# Patient Record
Sex: Female | Born: 1955 | Race: White | Hispanic: Yes | Marital: Single | Smoking: Never smoker
Health system: Southern US, Community
[De-identification: ages and names within clinical notes are randomized; demographics above are authoritative.]

## PROBLEM LIST (undated history)

## (undated) DIAGNOSIS — J309 Allergic rhinitis, unspecified: Secondary | ICD-10-CM

## (undated) DIAGNOSIS — T7840XA Allergy, unspecified, initial encounter: Secondary | ICD-10-CM

## (undated) DIAGNOSIS — M545 Low back pain: Secondary | ICD-10-CM

## (undated) DIAGNOSIS — H04123 Dry eye syndrome of bilateral lacrimal glands: Secondary | ICD-10-CM

## (undated) DIAGNOSIS — H11001 Unspecified pterygium of right eye: Secondary | ICD-10-CM

## (undated) DIAGNOSIS — E785 Hyperlipidemia, unspecified: Secondary | ICD-10-CM

## (undated) DIAGNOSIS — B351 Tinea unguium: Secondary | ICD-10-CM

## (undated) DIAGNOSIS — H11153 Pinguecula, bilateral: Secondary | ICD-10-CM

## (undated) DIAGNOSIS — E119 Type 2 diabetes mellitus without complications: Secondary | ICD-10-CM

## (undated) DIAGNOSIS — K5732 Diverticulitis of large intestine without perforation or abscess without bleeding: Secondary | ICD-10-CM

## (undated) DIAGNOSIS — I10 Essential (primary) hypertension: Secondary | ICD-10-CM

## (undated) HISTORY — DX: Allergic rhinitis, unspecified: J30.9

## (undated) HISTORY — DX: Tinea unguium: B35.1

## (undated) HISTORY — DX: Type 2 diabetes mellitus without complications: E11.9

## (undated) HISTORY — DX: Pinguecula, bilateral: H11.153

## (undated) HISTORY — DX: Low back pain: M54.5

## (undated) HISTORY — DX: Dry eye syndrome of bilateral lacrimal glands: H04.123

## (undated) HISTORY — DX: Essential (primary) hypertension: I10

## (undated) HISTORY — DX: Diverticulitis of large intestine without perforation or abscess without bleeding: K57.32

## (undated) HISTORY — DX: Unspecified pterygium of right eye: H11.001

## (undated) HISTORY — DX: Allergy, unspecified, initial encounter: T78.40XA

## (undated) HISTORY — DX: Hyperlipidemia, unspecified: E78.5

---

## 2003-10-11 ENCOUNTER — Ambulatory Visit: Payer: Self-pay | Admitting: Family Medicine

## 2003-10-25 ENCOUNTER — Ambulatory Visit: Payer: Self-pay | Admitting: Family Medicine

## 2003-11-15 ENCOUNTER — Ambulatory Visit: Payer: Self-pay | Admitting: Family Medicine

## 2003-12-27 ENCOUNTER — Ambulatory Visit: Payer: Self-pay | Admitting: Family Medicine

## 2004-01-24 ENCOUNTER — Ambulatory Visit: Payer: Self-pay | Admitting: Family Medicine

## 2004-04-06 ENCOUNTER — Ambulatory Visit: Payer: Self-pay | Admitting: Family Medicine

## 2004-05-05 ENCOUNTER — Ambulatory Visit: Payer: Self-pay | Admitting: Family Medicine

## 2004-05-11 ENCOUNTER — Ambulatory Visit (HOSPITAL_COMMUNITY): Admission: RE | Admit: 2004-05-11 | Discharge: 2004-05-11 | Payer: Self-pay | Admitting: Family Medicine

## 2004-06-06 ENCOUNTER — Ambulatory Visit: Payer: Self-pay | Admitting: Family Medicine

## 2004-06-15 ENCOUNTER — Ambulatory Visit: Payer: Self-pay | Admitting: Sports Medicine

## 2004-07-24 ENCOUNTER — Ambulatory Visit: Payer: Self-pay | Admitting: Family Medicine

## 2004-08-07 ENCOUNTER — Ambulatory Visit: Payer: Self-pay | Admitting: Family Medicine

## 2004-10-08 ENCOUNTER — Encounter (INDEPENDENT_AMBULATORY_CARE_PROVIDER_SITE_OTHER): Payer: Self-pay | Admitting: *Deleted

## 2004-10-16 ENCOUNTER — Ambulatory Visit: Payer: Self-pay | Admitting: Family Medicine

## 2004-12-27 ENCOUNTER — Ambulatory Visit: Payer: Self-pay | Admitting: Family Medicine

## 2005-01-15 ENCOUNTER — Ambulatory Visit: Payer: Self-pay | Admitting: Family Medicine

## 2005-06-18 ENCOUNTER — Ambulatory Visit: Payer: Self-pay | Admitting: Family Medicine

## 2005-06-21 ENCOUNTER — Ambulatory Visit (HOSPITAL_COMMUNITY): Admission: RE | Admit: 2005-06-21 | Discharge: 2005-06-21 | Payer: Self-pay | Admitting: Family Medicine

## 2005-07-16 ENCOUNTER — Ambulatory Visit: Payer: Self-pay | Admitting: Family Medicine

## 2005-10-29 ENCOUNTER — Ambulatory Visit (HOSPITAL_COMMUNITY): Admission: RE | Admit: 2005-10-29 | Discharge: 2005-10-29 | Payer: Self-pay | Admitting: Family Medicine

## 2005-10-29 ENCOUNTER — Ambulatory Visit: Payer: Self-pay | Admitting: Family Medicine

## 2005-12-24 ENCOUNTER — Ambulatory Visit: Payer: Self-pay | Admitting: Family Medicine

## 2006-03-07 DIAGNOSIS — E785 Hyperlipidemia, unspecified: Secondary | ICD-10-CM

## 2006-03-07 DIAGNOSIS — I10 Essential (primary) hypertension: Secondary | ICD-10-CM

## 2006-03-08 ENCOUNTER — Encounter (INDEPENDENT_AMBULATORY_CARE_PROVIDER_SITE_OTHER): Payer: Self-pay | Admitting: *Deleted

## 2006-04-15 ENCOUNTER — Ambulatory Visit: Payer: Self-pay | Admitting: Family Medicine

## 2006-04-15 LAB — CONVERTED CEMR LAB: Hgb A1c MFr Bld: 6.8 %

## 2006-05-07 ENCOUNTER — Ambulatory Visit: Payer: Self-pay | Admitting: Family Medicine

## 2006-05-07 ENCOUNTER — Encounter: Payer: Self-pay | Admitting: Family Medicine

## 2006-05-07 LAB — CONVERTED CEMR LAB
CO2: 23 meq/L (ref 19–32)
Calcium: 9.5 mg/dL (ref 8.4–10.5)
Cholesterol: 143 mg/dL (ref 0–200)
Creatinine, Ser: 0.79 mg/dL (ref 0.40–1.20)
HDL: 47 mg/dL (ref >39)
Sodium: 136 meq/L (ref 135–145)
Total CHOL/HDL Ratio: 3

## 2006-05-08 ENCOUNTER — Encounter: Payer: Self-pay | Admitting: Family Medicine

## 2006-05-13 ENCOUNTER — Ambulatory Visit: Payer: Self-pay | Admitting: Family Medicine

## 2006-05-24 ENCOUNTER — Encounter: Payer: Self-pay | Admitting: Family Medicine

## 2006-08-12 ENCOUNTER — Encounter: Payer: Self-pay | Admitting: Family Medicine

## 2006-08-12 ENCOUNTER — Ambulatory Visit: Payer: Self-pay | Admitting: Sports Medicine

## 2006-08-12 LAB — CONVERTED CEMR LAB: Hgb A1c MFr Bld: 7.2 %

## 2006-08-14 ENCOUNTER — Encounter: Payer: Self-pay | Admitting: Family Medicine

## 2006-09-03 ENCOUNTER — Ambulatory Visit (HOSPITAL_COMMUNITY): Admission: RE | Admit: 2006-09-03 | Discharge: 2006-09-03 | Payer: Self-pay | Admitting: Family Medicine

## 2006-09-03 ENCOUNTER — Encounter: Payer: Self-pay | Admitting: Family Medicine

## 2006-11-18 ENCOUNTER — Ambulatory Visit: Payer: Self-pay | Admitting: Family Medicine

## 2006-11-18 DIAGNOSIS — B351 Tinea unguium: Secondary | ICD-10-CM | POA: Insufficient documentation

## 2006-11-18 DIAGNOSIS — K5900 Constipation, unspecified: Secondary | ICD-10-CM | POA: Insufficient documentation

## 2006-11-18 HISTORY — DX: Tinea unguium: B35.1

## 2006-11-18 LAB — CONVERTED CEMR LAB: Hgb A1c MFr Bld: 7.5 %

## 2007-02-24 ENCOUNTER — Ambulatory Visit: Payer: Self-pay | Admitting: Family Medicine

## 2007-04-14 ENCOUNTER — Encounter: Payer: Self-pay | Admitting: Family Medicine

## 2007-04-14 ENCOUNTER — Ambulatory Visit: Payer: Self-pay | Admitting: Family Medicine

## 2007-04-14 LAB — CONVERTED CEMR LAB
Cholesterol: 144 mg/dL (ref 0–200)
HDL: 39 mg/dL — ABNORMAL LOW (ref >39)
LDL Cholesterol: 72 mg/dL (ref 0–99)
Triglycerides: 164 mg/dL — ABNORMAL HIGH (ref <150)
VLDL: 33 mg/dL (ref 0–40)

## 2007-04-15 ENCOUNTER — Encounter: Payer: Self-pay | Admitting: Family Medicine

## 2007-04-21 ENCOUNTER — Ambulatory Visit: Payer: Self-pay | Admitting: Family Medicine

## 2007-06-30 ENCOUNTER — Ambulatory Visit: Payer: Self-pay | Admitting: Family Medicine

## 2007-06-30 ENCOUNTER — Encounter: Payer: Self-pay | Admitting: Family Medicine

## 2007-06-30 LAB — CONVERTED CEMR LAB
Albumin: 4.6 g/dL (ref 3.5–5.2)
BUN: 20 mg/dL (ref 6–23)
CO2: 24 meq/L (ref 19–32)
Glucose, Bld: 96 mg/dL (ref 70–99)
Potassium: 4.4 meq/L (ref 3.5–5.3)
Sodium: 139 meq/L (ref 135–145)
Total Protein: 7 g/dL (ref 6.0–8.3)

## 2007-09-04 ENCOUNTER — Ambulatory Visit (HOSPITAL_COMMUNITY): Admission: RE | Admit: 2007-09-04 | Discharge: 2007-09-04 | Payer: Self-pay | Admitting: Family Medicine

## 2007-10-09 HISTORY — PX: BIOPSY ENDOMETRIAL: PRO11

## 2007-10-13 ENCOUNTER — Ambulatory Visit: Payer: Self-pay | Admitting: Family Medicine

## 2007-10-13 DIAGNOSIS — N949 Unspecified condition associated with female genital organs and menstrual cycle: Secondary | ICD-10-CM

## 2007-10-13 LAB — CONVERTED CEMR LAB: Hgb A1c MFr Bld: 8 %

## 2007-10-20 ENCOUNTER — Ambulatory Visit: Payer: Self-pay | Admitting: Family Medicine

## 2007-10-20 ENCOUNTER — Encounter: Payer: Self-pay | Admitting: Family Medicine

## 2007-10-22 ENCOUNTER — Encounter: Payer: Self-pay | Admitting: Family Medicine

## 2007-11-06 ENCOUNTER — Encounter: Payer: Self-pay | Admitting: Family Medicine

## 2007-11-28 ENCOUNTER — Encounter: Payer: Self-pay | Admitting: Family Medicine

## 2008-01-12 ENCOUNTER — Ambulatory Visit: Payer: Self-pay | Admitting: Family Medicine

## 2008-01-12 LAB — CONVERTED CEMR LAB: Hgb A1c MFr Bld: 8.6 %

## 2008-02-16 ENCOUNTER — Ambulatory Visit: Payer: Self-pay | Admitting: Family Medicine

## 2008-04-26 ENCOUNTER — Ambulatory Visit: Payer: Self-pay | Admitting: Family Medicine

## 2008-04-26 LAB — CONVERTED CEMR LAB
AST: 30 units/L (ref 0–37)
Alkaline Phosphatase: 66 units/L (ref 39–117)
BUN: 15 mg/dL (ref 6–23)
Calcium: 9.7 mg/dL (ref 8.4–10.5)
Creatinine, Ser: 0.71 mg/dL (ref 0.40–1.20)
HDL: 41 mg/dL (ref >39)
Total Bilirubin: 0.4 mg/dL (ref 0.3–1.2)
Total CHOL/HDL Ratio: 3.7
VLDL: 27 mg/dL (ref 0–40)

## 2008-05-03 ENCOUNTER — Ambulatory Visit: Payer: Self-pay | Admitting: Family Medicine

## 2008-05-03 LAB — CONVERTED CEMR LAB: Hgb A1c MFr Bld: 9 %

## 2008-05-31 ENCOUNTER — Ambulatory Visit: Payer: Self-pay | Admitting: Family Medicine

## 2008-07-05 ENCOUNTER — Ambulatory Visit: Payer: Self-pay | Admitting: Family Medicine

## 2008-08-02 ENCOUNTER — Ambulatory Visit: Payer: Self-pay | Admitting: Family Medicine

## 2008-08-02 LAB — CONVERTED CEMR LAB: Hgb A1c MFr Bld: 6.6 %

## 2008-11-01 ENCOUNTER — Ambulatory Visit: Payer: Self-pay | Admitting: Family Medicine

## 2008-11-01 ENCOUNTER — Encounter (INDEPENDENT_AMBULATORY_CARE_PROVIDER_SITE_OTHER): Payer: Self-pay | Admitting: *Deleted

## 2008-11-29 ENCOUNTER — Encounter: Payer: Self-pay | Admitting: Family Medicine

## 2009-02-14 ENCOUNTER — Ambulatory Visit: Payer: Self-pay | Admitting: Family Medicine

## 2009-05-16 ENCOUNTER — Ambulatory Visit: Payer: Self-pay | Admitting: Family Medicine

## 2009-05-16 ENCOUNTER — Encounter: Payer: Self-pay | Admitting: Family Medicine

## 2009-05-16 LAB — CONVERTED CEMR LAB
AST: 15 units/L (ref 0–37)
Albumin: 4.8 g/dL (ref 3.5–5.2)
Alkaline Phosphatase: 57 units/L (ref 39–117)
Chloride: 103 meq/L (ref 96–112)
Direct LDL: 134 mg/dL — ABNORMAL HIGH
Glucose, Bld: 66 mg/dL — ABNORMAL LOW (ref 70–99)
Potassium: 3.9 meq/L (ref 3.5–5.3)
Sodium: 140 meq/L (ref 135–145)
Total Protein: 7.6 g/dL (ref 6.0–8.3)

## 2009-05-19 ENCOUNTER — Telehealth: Payer: Self-pay | Admitting: Family Medicine

## 2009-05-19 ENCOUNTER — Encounter: Payer: Self-pay | Admitting: Family Medicine

## 2009-07-25 ENCOUNTER — Encounter: Payer: Self-pay | Admitting: Family Medicine

## 2009-07-25 ENCOUNTER — Ambulatory Visit: Payer: Self-pay | Admitting: Family Medicine

## 2009-07-26 ENCOUNTER — Telehealth: Payer: Self-pay | Admitting: Family Medicine

## 2009-09-19 ENCOUNTER — Ambulatory Visit: Payer: Self-pay | Admitting: Family Medicine

## 2009-09-29 ENCOUNTER — Ambulatory Visit (HOSPITAL_COMMUNITY): Admission: RE | Admit: 2009-09-29 | Discharge: 2009-09-29 | Payer: Self-pay | Admitting: Family Medicine

## 2009-10-17 ENCOUNTER — Ambulatory Visit: Payer: Self-pay | Admitting: Family Medicine

## 2009-11-08 ENCOUNTER — Encounter: Payer: Self-pay | Admitting: Pharmacist

## 2009-11-14 ENCOUNTER — Ambulatory Visit: Payer: Self-pay | Admitting: Family Medicine

## 2009-11-14 LAB — CONVERTED CEMR LAB
Eosinophils Relative: 3 % (ref 0–5)
GC Probe Amp, Genital: NEGATIVE
HCT: 38.5 % (ref 36.0–46.0)
Hemoglobin: 12.2 g/dL (ref 12.0–15.0)
Lymphocytes Relative: 17 % (ref 12–46)
Lymphs Abs: 1.5 10*3/uL (ref 0.7–4.0)
Monocytes Absolute: 0.8 10*3/uL (ref 0.1–1.0)
WBC: 9 10*3/uL (ref 4.0–10.5)

## 2009-11-16 ENCOUNTER — Encounter: Payer: Self-pay | Admitting: Family Medicine

## 2009-11-16 ENCOUNTER — Telehealth: Payer: Self-pay | Admitting: Family Medicine

## 2009-11-28 ENCOUNTER — Ambulatory Visit: Payer: Self-pay | Admitting: Family Medicine

## 2009-11-28 DIAGNOSIS — E1149 Type 2 diabetes mellitus with other diabetic neurological complication: Secondary | ICD-10-CM

## 2009-11-28 DIAGNOSIS — K5732 Diverticulitis of large intestine without perforation or abscess without bleeding: Secondary | ICD-10-CM | POA: Insufficient documentation

## 2009-11-28 HISTORY — DX: Diverticulitis of large intestine without perforation or abscess without bleeding: K57.32

## 2009-12-26 ENCOUNTER — Ambulatory Visit: Payer: Self-pay

## 2010-02-05 LAB — CONVERTED CEMR LAB
Beta hcg, urine, semiquantitative: NEGATIVE
MCHC: 32.9 g/dL (ref 30.0–36.0)
MCV: 79.8 fL (ref 78.0–100.0)
Platelets: 475 10*3/uL — ABNORMAL HIGH (ref 150–400)
RDW: 15 % (ref 11.5–15.5)
TSH: 2.497 microintl units/mL (ref 0.350–4.50)

## 2010-02-08 NOTE — Progress Notes (Signed)
Summary: Allegic reaction   Phone Note Call from Patient   Caller: Patient Summary of Call: Pt stated after she started FLAGIL 500mg  started  with allergic, she stated to  has a rash on her hands and arms. She stop to take the medicine. Initial call taken by: Marines Jean Rosenthal,  November 16, 2009 10:29 AM  Follow-up for Phone Call        Tell her to stop both the flagyl and the Cipo - don't know which one she's having the allergic reaction to.  I will need to ask Dr. Sheffield Slider for a substitute. We will call her back. Follow-up by: Dennison Nancy RN,  November 16, 2009 10:31 AM  Additional Follow-up for Phone Call Additional follow up Details #1::        I called pt and tell her stop take flagil and cipo. She will wait for [facility] call her. Additional Follow-up by: Marines Jean Rosenthal,  November 16, 2009 11:24 AM   New Allergies: FLAGYL (METRONIDAZOLE) CIPRO (CIPROFLOXACIN HCL) New/Updated Medications: AMOXICILLIN-POT CLAVULANATE 1000-62.5 MG XR12H-TAB (AMOXICILLIN-POT CLAVULANATE) Take one tab two times a day New Allergies: FLAGYL (METRONIDAZOLE) CIPRO (CIPROFLOXACIN HCL)Prescriptions: AMOXICILLIN-POT CLAVULANATE 1000-62.5 MG XR12H-TAB (AMOXICILLIN-POT CLAVULANATE) Take one tab two times a day  #14 x 0   Entered and Authorized by:   Zachery Dauer MD   Signed by:   Zachery Dauer MD on 11/16/2009   Method used:   Electronically to        [id number]* (retail)       [address] [place], [place]  [address]       Ph: [phone]       Fax: [phone]   RxID:   [id number]

## 2010-02-08 NOTE — Assessment & Plan Note (Signed)
Summary: Abdominal pain/ eo   Vital Signs:  Patient profile:   55 year old female Menstrual status:  regular Height:      57.5 inches Weight:      114 pounds BMI:     24.33 Temp:     98.4 degrees F oral Pulse rate:   92 / minute BP sitting:   147 / 74  (right arm) Cuff size:   regular  Vitals Entered By: Tessie Fass CMA (November 14, 2009 1:50 PM) CC: F/U, Hypertension Management, Lipid Management Is Patient Diabetic? Yes Pain Assessment Patient in pain? yes     Location: abdomen Intensity: 5   Primary Care Erin Small:  Zachery Dauer  CC:  F/U, Hypertension Management, and Lipid Management.  History of Present Illness: Erin Small is a pleasant 55 year old Hispanic female who [facility] for f/u of cough.  Patient reports that her cough has resolved with completion of her antibx therapy.    Today she has c/o "ovary pain" which began 2 days ago and feels similar to "ovary pain" that she had in Jan/Feb per patient report.  She describes this left lower abdomen pain as crampy and persistent especially when sitting upright.  She denies nausea, vomiting, diarrhea or constipation but reports increased abdominal bloating and gas sounds but no increased flatulence.    She is married and has had recent sexual intercourse.  Her LMP was Oct 2011 but spotty.  She reports occasional hot flashes but denies vaginal itchiness or dryness.  Recent capillary blood glucose have not been elevated.   Hypertension History:      She denies chest pain and palpitations.        Positive major cardiovascular risk factors include diabetes, hyperlipidemia, and hypertension.  Negative major cardiovascular risk factors include female age less than 43 years old, negative family history for ischemic heart disease, and non-tobacco-user status.        Further assessment for target organ damage reveals no history of ASHD, stroke/TIA, or peripheral vascular disease.    Lipid Management History:    Positive NCEP/ATP III risk factors include diabetes and hypertension.  Negative NCEP/ATP III risk factors include female age less than 64 years old, no family history for ischemic heart disease, non-tobacco-user status, no ASHD (atherosclerotic heart disease), no prior stroke/TIA, no peripheral vascular disease, and no history of aortic aneurysm.      Allergies: No Known Drug Allergies  Physical Exam  General:  Mildly ill appearing  Lungs:  Normal respiratory effort, chest expands symmetrically. Lungs are clear to auscultation, no crackles or wheezes. Abdomen:  Bowel sounds positive,abdomen soft and non-tender without masses, organomegaly or hernias noted. No guarding but complains of discomfort in the LLQ when pressed anywhere in the abdomen. No pain with cough or percussion.  Rectal:  No external abnormalities noted. Normal sphincter tone. No rectal masses or tenderness. Small amount of soft stool, heme neg Genitalia:  normal introitus, no external lesions, no vaginal discharge, mucosa pink and moist, no vaginal or cervical lesions, no friaility or hemorrhage, normal uterus size and position, and no adnexal masses. Slightly tender left adnexae or above. Psych:  normally interactive.     Impression & Recommendations:  Problem # 1:  diverticulitis 1. pelvic exam performed, no cervical motion or significant adnexal tenderness 2. will prescribe Ciprofloxacin 500mg  bid and Metronidazole 500mg  three times a day 3. will check CBC with diff to look for WBC shift 4. Urinalysis today r/o UTI 5. flu  shot today for preventative care 6. Pap smear today; GC/Chlamydia probe; hemoccult negative today  Problem # 2:  DIABETES MELLITUS II, UNCOMPLICATED (ICD-250.00)  Her updated medication list for this problem includes:    Bayer Childrens Aspirin 81 Mg Chew (Aspirin) .Marland Kitchen... Take 1 tablet by mouth once a day    Metformin Hcl 1000 Mg Tabs (Metformin hcl) .Marland Kitchen... Take one by mouth two times a day     Lisinopril-hydrochlorothiazide 10-12.5 Mg Tabs (Lisinopril-hydrochlorothiazide) .Marland Kitchen... 2 tablet by mouth daily    Diabeta 5 Mg Tabs (Glyburide) .Marland Kitchen... Take two in the morning and two in the p.m.  Orders: [facility]- Est Level  3 (29562)  Complete Medication List: 1)  Bayer Childrens Aspirin 81 Mg Chew (Aspirin) .... Take 1 tablet by mouth once a day 2)  Metformin Hcl 1000 Mg Tabs (Metformin hcl) .... Take one by mouth two times a day 3)  Simvastatin 40 Mg Tabs (Simvastatin) .... Take 2 tablets by mouth at bedtime 4)  Lisinopril-hydrochlorothiazide 10-12.5 Mg Tabs (Lisinopril-hydrochlorothiazide) .... 2 tablet by mouth daily 5)  Therapeutic Multivitamin Tabs (Multiple vitamin) .... Take one tab daily 6)  Caltrate 600+d 600-400 Mg-unit Tabs (Calcium carbonate-vitamin d) .... Take one tablet twice daily 7)  Diabeta 5 Mg Tabs (Glyburide) .... Take two in the morning and two in the p.m. 8)  Cipro 500 Mg Tabs (Ciprofloxacin hcl) .... Take one tab two times a day for 10 days 9)  Flagyl 500 Mg Tabs (Metronidazole) .... Take one tab three times a day  Other Orders: CBC w/Diff-[facility] (13086) GC/Chlamydia-[facility] (87591/87491) Pap Smear-[facility] (57846-96295) Urinalysis-[facility] (00000) U Preg-[facility] (28413)  Hypertension Assessment/Plan:      The patient's hypertensive risk group is category C: Target organ damage and/or diabetes.  Her calculated 10 year risk of coronary heart disease is 17 %.  Today's blood pressure is 147/74.  Her blood pressure goal is < 130/80.  Lipid Assessment/Plan:      Based on NCEP/ATP III, the patient's risk factor category is "history of diabetes".  The patient's lipid goals are as follows: Total cholesterol goal is 200; LDL cholesterol goal is 100; HDL cholesterol goal is 40; Triglyceride goal is 150.     Patient Instructions: 1)  Please schedule a follow-up appointment in 1 week.  2)  Regrese en una semana.  3)  Voy a llamarle si hay problema con las pruebas de [place].   Prescriptions: FLAGYL 500 MG TABS (METRONIDAZOLE) Take one tab three times a day  #30 x 0   Entered by:   Milinda Antis MD   Authorized by:   Zachery Dauer MD   Signed by:   Milinda Antis MD on 11/14/2009   Method used:   Electronically to        [facility] #[id number]* (retail)       [address] [place], [place]  [address]       Ph: [phone]       Fax: [phone]   RxID:   [id number] CIPRO 500 MG TABS (CIPROFLOXACIN HCL) Take one tab two times a day for 10 days  #20 x 0   Entered by:   Milinda Antis MD   Authorized by:   Zachery Dauer MD   Signed by:   Milinda Antis MD on 11/14/2009   Method used:   Electronically to        [facility] #[id number]* (retail)       [address]       [place], [place]  [address]       Ph: [phone]       Fax: [phone]   RxID:   [id number]    Orders Added: 1)  CBC w/Diff-[facility] [85025] 2)  GC/Chlamydia-[facility] [87591/87491] 3)  Pap Smear-[facility] [74259-56387] 4)  Urinalysis-[facility] [00000] 5)  [facility]- Est Level  3 [99213] 6)  U Preg-[facility] [81025]     Prevention & Chronic Care Immunizations   Influenza vaccine: Fluvax Non-MCR  (11/01/2008)   Influenza vaccine due: 10/12/2008    Tetanus booster: 05/08/2004: Done.   Tetanus booster due: 05/09/2014    Pneumococcal vaccine: Done.  (06/08/2005)   Pneumococcal vaccine due: None  Colorectal Screening   Hemoccult: Not documented    Colonoscopy: Not documented  Other Screening   Pap smear: NEGATIVE FOR INTRAEPITHELIAL LESIONS OR MALIGNANCY.  (10/20/2007)   Pap smear due: 10/19/2008    Mammogram: Assessment: BIRADS 1. Location: [facility].     (09/29/2009)   Mammogram action/deferral: Screening mammogram in 1 year.     (09/29/2009)   Mammogram due: 09/04/2008   Smoking status: never  (10/17/2009)  Diabetes Mellitus   HgbA1C: 7.9  (09/19/2009)   Hemoglobin A1C due: 05/14/2009    Eye exam: No diabetic retinopathy.   [facility] eye   (11/29/2008)   Eye exam due: 11/29/2009    Foot exam: yes  (05/16/2009)   Foot exam action/deferral: Do today   High risk foot: Not documented   Foot care education: completed  (07/05/2008)   Foot exam due: 07/05/2009    Urine microalbumin/creatinine ratio: Not documented   Urine microalbumin/cr due: Not Indicated  Lipids   Total Cholesterol: 151  (04/26/2008)   LDL: 83  (04/26/2008)   LDL Direct: 73  (07/25/2009)   HDL: 41  (04/26/2008)   Triglycerides: 133  (04/26/2008)    SGOT (AST): 15  (05/16/2009)   SGPT (ALT): 22  (05/16/2009)   Alkaline phosphatase: 57  (05/16/2009)   Total bilirubin: 0.3  (05/16/2009)  Hypertension   Last Blood Pressure: 147 / 74  (11/14/2009)   Serum creatinine: 0.81  (05/16/2009)   Serum potassium 3.9  (05/16/2009)  Self-Management Support :   Personal Goals (by the next clinic visit) :     Personal A1C goal: 7  (11/01/2008)     Personal blood pressure goal: 130/80  (11/01/2008)     Personal LDL goal: 100  (11/01/2008)    Diabetes self-management support: CBG self-monitoring log, Education handout  (09/19/2009)   Last diabetes self-management training by diabetes educator: completed    Hypertension self-management support: Not documented    Lipid self-management support: Not documented    Appended Document: urine reports    Lab Visit  Laboratory Results   Urine Tests  Date/Time Received: November 14, 2009 3:33 PM  Date/Time Reported: November 14, 2009 3:38 PM   Routine Urinalysis   Color: yellow Appearance: Clear Glucose: 100   (Normal Range: Negative) Bilirubin: negative   (Normal Range: Negative) Ketone: trace (5)   (Normal Range: Negative) Spec. Gravity: 1.025   (Normal Range: 1.003-1.035) Blood: negative   (Normal Range: Negative) pH: 5.5   (Normal Range: 5.0-8.0) Protein: negative   (Normal Range: Negative) Urobilinogen: 0.2   (Normal Range: 0-1) Nitrite: negative   (Normal Range: Negative) Leukocyte Esterace:  negative   (Normal Range: Negative)    Urine HCG: negative Comments: ...............test performed by......Marland KitchenBonnie A. [place], MLS (ASCP)cm    Orders Today:

## 2010-02-08 NOTE — Progress Notes (Signed)
  Phone Note Outgoing Call   Call placed by: Paula Compton MD,  July 26, 2009 7:59 AM Call placed to: Patient    Called patient at home, conversation in [place].  Relayed excellent LDL value.  She is pleased.  Changing diet to reduce cholesterol intake.  Plan to follow up on DM in 2 months.  She agrees, will call for appt.  Paula Compton MD  July 26, 2009 8:01 AM

## 2010-02-08 NOTE — Assessment & Plan Note (Signed)
Summary: f/u,df   Vital Signs:  Patient profile:   55 year old female Menstrual status:  regular Height:      57.5 inches Weight:      112 pounds BMI:     23.90 Temp:     100 degrees F oral Pulse rate:   88 / minute BP sitting:   130 / 80  Vitals Entered By: Arlyss Repress CMA, (October 17, 2009 1:38 PM) CC: body aches and cough. feels like the flu. Is Patient Diabetic? Yes Pain Assessment Patient in pain? yes     Location: body Intensity: 5 Onset of pain  x2 days.   Primary Care Provider:  Zachery Dauer  CC:  body aches and cough. feels like the flu.Marland Kitchen  History of Present Illness: Four days of "grippe" with myalgias,fever, chills, and increasing cough. Chest hurts both sides with the cough which is dry.  No nausea or diarrhea.   fasting capillary blood glucoses all under 100, those at bedtime 157, 171.   Her metformin label says 1.5 in AM and 1 in PM of 100 mg, but she is only taking 1 tab two times a day.  Habits & Providers  Alcohol-Tobacco-Diet     Tobacco Status: never  Allergies: No Known Drug Allergies  Physical Exam  General:  Mildly ill appearing  Eyes:  No corneal or conjunctival inflammation noted. EOMI. Perrla. Funduscopic exam benign, without hemorrhages, exudates or papilledema. Vision grossly normal. Ears:  External ear exam shows no significant lesions or deformities.  Otoscopic examination reveals clear canals, tympanic membranes are intact bilaterally without bulging, retraction, inflammation or discharge. Hearing is grossly normal bilaterally. Nose:  clear nasal discharge Mouth:  Oral mucosa and oropharynx without lesions or exudates.  Teeth in good repair. Lungs:  crackles and rhonchi RLL posteriorly that did not clear with multiple coughs. Non-productive Heart:  Normal rate and regular rhythm. S1 and S2 normal without gallop, murmur, click, rub or other extra sounds. Abdomen:  Bowel sounds positive,abdomen soft and non-tender without masses,  organomegaly or hernias noted.   Impression & Recommendations:  Problem # 1:  BACTERIAL PNEUMONIA, RIGHT LOWER LOBE (ICD-482.9)  May be viral, but given her diabetes and lack of insurance coverage for a chest x-ray or labs will treat to cover pneumococcus and  Her updated medication list for this problem includes:    Avelox 400 Mg Tabs (Moxifloxacin hcl) .Marland Kitchen... Take one tablet daily for 7 days.  Orders: [facility]- Est Level  3 (03474)  Problem # 2:  DIABETES MELLITUS II, UNCOMPLICATED (ICD-250.00)  Improved  Her updated medication list for this problem includes:    Bayer Childrens Aspirin 81 Mg Chew (Aspirin) .Marland Kitchen... Take 1 tablet by mouth once a day    Metformin Hcl 1000 Mg Tabs (Metformin hcl) .Marland Kitchen... Take one by mouth two times a day    Lisinopril-hydrochlorothiazide 10-12.5 Mg Tabs (Lisinopril-hydrochlorothiazide) .Marland Kitchen... 2 tablet by mouth daily    Diabeta 5 Mg Tabs (Glyburide) .Marland Kitchen... Take two in the morning and two in the p.m.  Orders: [facility]- Est Level  3 (25956)  Complete Medication List: 1)  Bayer Childrens Aspirin 81 Mg Chew (Aspirin) .... Take 1 tablet by mouth once a day 2)  Metformin Hcl 1000 Mg Tabs (Metformin hcl) .... Take one by mouth two times a day 3)  Simvastatin 40 Mg Tabs (Simvastatin) .... Take 2 tablets by mouth at bedtime 4)  Lisinopril-hydrochlorothiazide 10-12.5 Mg Tabs (Lisinopril-hydrochlorothiazide) .... 2 tablet by mouth daily 5)  Therapeutic Multivitamin Tabs (  Multiple vitamin) .... Take one tab daily 6)  Caltrate 600+d 600-400 Mg-unit Tabs (Calcium carbonate-vitamin d) .... Take one tablet twice daily 7)  Diabeta 5 Mg Tabs (Glyburide) .... Take two in the morning and two in the p.m. 8)  Avelox 400 Mg Tabs (Moxifloxacin hcl) .... Take one tablet daily for 7 days.  Patient Instructions: 1)  Please schedule a follow-up appointment in 1 month.  2)  [name] cita en un mes para visita y vacuna para la gripe.  3)  Llame Dr Sheffield Slider en 3 dias, antes si no puede tomar la  medicina.  Prescriptions: AVELOX 400 MG TABS (MOXIFLOXACIN HCL) Take one tablet daily for 7 days.  #7 x 0   Entered and Authorized by:   Zachery Dauer MD   Signed by:   Zachery Dauer MD on 10/17/2009   Method used:   Print then Give to Patient   RxID:   [id number] AVELOX 400 MG TABS (MOXIFLOXACIN HCL) Take one tablet daily for 7 days.  #7 x 0   Entered and Authorized by:   Zachery Dauer MD   Signed by:   Zachery Dauer MD on 10/17/2009   Method used:   Electronically to        [id number]* (retail)       [address] [place], [place]  [address]       Ph: [phone]       Fax: [phone]   RxID:   [id number]    Prevention & Chronic Care Immunizations   Influenza vaccine: Fluvax Non-MCR  (11/01/2008)   Influenza vaccine due: 10/12/2008    Tetanus booster: 05/08/2004: Done.   Tetanus booster due: 05/09/2014    Pneumococcal vaccine: Done.  (06/08/2005)   Pneumococcal vaccine due: None  Colorectal Screening   Hemoccult: Not documented    Colonoscopy: Not documented  Other Screening   Pap smear: NEGATIVE FOR INTRAEPITHELIAL LESIONS OR MALIGNANCY.  (10/20/2007)   Pap smear due: 10/19/2008    Mammogram: Assessment: BIRADS 1. Location: [facility].     (09/29/2009)   Mammogram action/deferral: Screening mammogram in 1 year.     (09/29/2009)   Mammogram due: 09/04/2008   Smoking status: never  (10/17/2009)  Diabetes Mellitus   HgbA1C: 7.9  (09/19/2009)   Hemoglobin A1C due: 05/14/2009    Eye exam: No diabetic retinopathy.   [facility] eye  (11/29/2008)   Eye exam due: 11/29/2009    Foot exam: yes  (05/16/2009)   Foot exam action/deferral: Do today   High risk foot: Not documented   Foot care education: completed  (07/05/2008)   Foot exam due: 07/05/2009    Urine microalbumin/creatinine ratio: Not documented   Urine microalbumin/cr due: Not Indicated    Diabetes flowsheet reviewed?: Yes   Progress toward A1C goal:  Improved  Lipids   Total Cholesterol: 151  (04/26/2008)   LDL: 83  (04/26/2008)   LDL Direct: 73  (07/25/2009)   HDL: 41  (04/26/2008)   Triglycerides: 133  (04/26/2008)    SGOT (AST): 15  (05/16/2009)   SGPT (ALT): 22  (05/16/2009)   Alkaline phosphatase: 57  (05/16/2009)   Total bilirubin: 0.3  (05/16/2009)    Lipid flowsheet reviewed?: Yes   Progress toward LDL goal: At goal  Hypertension   Last Blood Pressure: 130 / 80  (10/17/2009)   Serum creatinine: 0.81  (05/16/2009)   Serum potassium 3.9  (05/16/2009)    Hypertension flowsheet  reviewed?: Yes   Progress toward BP goal: At goal  Self-Management Support :   Personal Goals (by the next clinic visit) :     Personal A1C goal: 7  (11/01/2008)     Personal blood pressure goal: 130/80  (11/01/2008)     Personal LDL goal: 100  (11/01/2008)    Diabetes self-management support: CBG self-monitoring log, Education handout  (09/19/2009)   Last diabetes self-management training by diabetes educator: completed    Hypertension self-management support: Not documented    Lipid self-management support: Not documented

## 2010-02-08 NOTE — Progress Notes (Signed)
  Phone Note Outgoing Call   Call placed by: Paula Compton MD,  May 19, 2009 4:56 PM Call placed to: Patient Summary of Call: Called to discuss labs.  A1C the same, elevated LDL above goal of 100.  To increase simva to 80/day, recheck in 2 months (after July 4th).  Will send new Rx for simva, future-order for direct LDL. Initial call taken by: Paula Compton MD,  May 19, 2009 4:57 PM    New/Updated Medications: SIMVASTATIN 40 MG TABS (SIMVASTATIN) Take 2 tablets by mouth at bedtime Prescriptions: SIMVASTATIN 40 MG TABS (SIMVASTATIN) Take 2 tablets by mouth at bedtime  #60 x 6   Entered and Authorized by:   Paula Compton MD   Signed by:   Paula Compton MD on 05/19/2009   Method used:   Electronically to        [facility] #[id number]* (retail)       [address] [place], [place]  [address]       Ph: [phone]       Fax: [phone]   RxID:   [id number]

## 2010-02-08 NOTE — Assessment & Plan Note (Signed)
Summary: problem list rev    Prevention & Chronic Care Immunizations   Influenza vaccine: Fluvax Non-MCR  (11/01/2008)   Influenza vaccine due: 10/12/2008    Tetanus booster: 05/08/2004: Done.   Tetanus booster due: 05/09/2014    Pneumococcal vaccine: Done.  (06/08/2005)   Pneumococcal vaccine due: None  Colorectal Screening   Hemoccult: Not documented    Colonoscopy: Not documented  Other Screening   Pap smear:  Specimen Adequacy: Satisfactory for evaluation.   Interpretation/Result:Negative for intraepithelial Lesion or Malignancy.     (11/14/2009)   Pap smear due: 10/19/2008    Mammogram: Assessment: BIRADS 1. Location: [facility].     (09/29/2009)   Mammogram action/deferral: Screening mammogram in 1 year.     (09/29/2009)   Mammogram due: 09/04/2008   Smoking status: never  (10/17/2009)  Diabetes Mellitus   HgbA1C: 7.9  (09/19/2009)   Hemoglobin A1C due: 05/14/2009    Eye exam: No diabetic retinopathy.   [facility] eye  (11/29/2008)   Eye exam due: 11/29/2009    Foot exam: yes  (05/16/2009)   Foot exam action/deferral: Do today   High risk foot: Not documented   Foot care education: completed  (07/05/2008)   Foot exam due: 07/05/2009    Urine microalbumin/creatinine ratio: Not documented   Urine microalbumin/cr due: Not Indicated  Lipids   Total Cholesterol: 151  (04/26/2008)   LDL: 83  (04/26/2008)   LDL Direct: 73  (07/25/2009)   HDL: 41  (04/26/2008)   Triglycerides: 133  (04/26/2008)    SGOT (AST): 15  (05/16/2009)   SGPT (ALT): 22  (05/16/2009)   Alkaline phosphatase: 57  (05/16/2009)   Total bilirubin: 0.3  (05/16/2009)  Hypertension   Last Blood Pressure: 147 / 74  (11/14/2009)   Serum creatinine: 0.81  (05/16/2009)   Serum potassium 3.9  (05/16/2009)  Self-Management Support :   Personal Goals (by the next clinic visit) :     Personal A1C goal: 7  (11/01/2008)     Personal blood pressure goal: 130/80   (11/01/2008)     Personal LDL goal: 100  (11/01/2008)    Diabetes self-management support: CBG self-monitoring log, Education handout  (09/19/2009)   Last diabetes self-management training by diabetes educator: completed    Hypertension self-management support: Not documented    Lipid self-management support: Not documented

## 2010-02-08 NOTE — Assessment & Plan Note (Signed)
Summary: FU/DIABET/MJ   Vital Signs:  Patient profile:   55 year old female Menstrual status:  regular Height:      57.5 inches Weight:      116.6 pounds BMI:     24.88 Temp:     98.1 degrees F oral Pulse rate:   83 / minute Pulse rhythm:   regular BP sitting:   135 / 91  (right arm) Cuff size:   regular  Vitals Entered By: Loralee Pacas CMA (September 19, 2009 1:34 PM) CC: dm follow up Is Patient Diabetic? Yes Did you bring your meter with you today? No   Primary Care Provider:  Zachery Dauer  CC:  dm follow up.  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 Hasn't gotten regualar exercise and the food supplied has been high carb and fat.  She is taking her medications that she gets from [employer] [facility] using a discount card that she bought there which will expire in November.   She feels well generally, but has a tickle cough in her throat and wonders if it is due to her thyroid. Denies asthma, reflux, dyspnea on exertion. The cough is not severe enough for her to want to change medications.   capillary blood glucoses have mainly been in the low 100's when taken fasting.  Habits & Providers  Alcohol-Tobacco-Diet     Tobacco Status: never  Current Medications (verified): 1)  Bayer Childrens Aspirin 81 Mg Chew (Aspirin) .... Take 1 Tablet By Mouth Once A Day 2)  Metformin Hcl 1000 Mg Tabs (Metformin Hcl) .... Take One By Mouth Two Times A Day 3)  Simvastatin 40 Mg Tabs (Simvastatin) .... Take 2 Tablets By Mouth At Bedtime 4)  Lisinopril-Hydrochlorothiazide 10-12.5 Mg Tabs (Lisinopril-Hydrochlorothiazide) .... 2 Tablet By Mouth Daily 5)  Therapeutic Multivitamin   Tabs (Multiple Vitamin) .... Take One Tab Daily 6)  Caltrate 600+d 600-400 Mg-Unit  Tabs (Calcium Carbonate-Vitamin D) .... Take One Tablet Twice Daily 7)  Diabeta 5 Mg Tabs (Glyburide) .... Take Two in The Morning and Two in The P.m.  Allergies (verified): No Known  Drug Allergies  Physical Exam  General:  Well appearing, no apparent distress.  Neck:  Thyroid normal  Lungs:  Normal respiratory effort, chest expands symmetrically. Lungs are clear to auscultation, no crackles or wheezes. Heart:  Normal rate and regular rhythm. S1 and S2 normal without gallop, murmur, click, rub or other extra sounds. Cervical Nodes:  No lymphadenopathy noted   Impression & Recommendations:  Problem # 1:  DIABETES MELLITUS II, UNCOMPLICATED (ICD-250.00) Will maximize the Glyburide, but recommended she visit the county pharmacy if she can get [employer] certification Her updated medication list for this problem includes:    Bayer Childrens Aspirin 81 Mg Chew (Aspirin) .Marland Kitchen... Take 1 tablet by mouth once a day    Metformin Hcl 1000 Mg Tabs (Metformin hcl) .Marland Kitchen... Take one by mouth two times a day    Lisinopril-hydrochlorothiazide 10-12.5 Mg Tabs (Lisinopril-hydrochlorothiazide) .Marland Kitchen... 2 tablet by mouth daily    Diabeta 5 Mg Tabs (Glyburide) .Marland Kitchen... Take two in the morning and two in the p.m.  Orders: A1C-[facility] (16109) [facility]- Est Level  3 ([address])  Problem # 2:  HYPERTENSION, BENIGN SYSTEMIC (ICD-401.1) She is to call if the cough worsens in case it is due to ACE. If so will add a third agent  Her updated medication list for this problem includes:    Lisinopril-hydrochlorothiazide 10-12.5 Mg Tabs (Lisinopril-hydrochlorothiazide) .Marland KitchenMarland KitchenMarland KitchenMarland Kitchen 2  tablet by mouth daily  Orders: [facility]- Est Level  3 ([address]40)  Complete Medication List: 1)  Bayer Childrens Aspirin 81 Mg Chew (Aspirin) .... Take 1 tablet by mouth once a day 2)  Metformin Hcl 1000 Mg Tabs (Metformin hcl) .... Take one by mouth two times a day 3)  Simvastatin 40 Mg Tabs (Simvastatin) .... Take 2 tablets by mouth at bedtime 4)  Lisinopril-hydrochlorothiazide 10-12.5 Mg Tabs (Lisinopril-hydrochlorothiazide) .... 2 tablet by mouth daily 5)  Therapeutic Multivitamin Tabs (Multiple vitamin) .... Take one tab daily 6)  Caltrate  600+d 600-400 Mg-unit Tabs (Calcium carbonate-vitamin d) .... Take one tablet twice daily 7)  Diabeta 5 Mg Tabs (Glyburide) .... Take two in the morning and two in the p.m.  Other Orders: Mammogram (Mammogram)  Patient Instructions: 1)  Meta: Hacer 30 minutos de caminar 3 veces a la semanas. 2)  Va a evitar french fries, papas, pan, dulce. Mas verduras y frutas.  3)  Please schedule a follow-up appointment in 1 month. Will give flu shot at that time Prescriptions: LISINOPRIL-HYDROCHLOROTHIAZIDE 10-12.5 MG TABS (LISINOPRIL-HYDROCHLOROTHIAZIDE) 2 tablet by mouth daily  #90 x 6   Entered and Authorized by:   Zachery Dauer MD   Signed by:   Zachery Dauer MD on 09/19/2009   Method used:   Print then Give to Patient   RxID:   [id number] DIABETA 5 MG TABS (GLYBURIDE) Take two in the morning and two in the P.M.  #90 x 6   Entered and Authorized by:   Zachery Dauer MD   Signed by:   Zachery Dauer MD on 09/19/2009   Method used:   Print then Give to Patient   RxID:   [id number] LISINOPRIL-HYDROCHLOROTHIAZIDE 10-12.5 MG TABS (LISINOPRIL-HYDROCHLOROTHIAZIDE) 2 tablet by mouth daily  #90 x 5   Entered and Authorized by:   Zachery Dauer MD   Signed by:   Zachery Dauer MD on 09/19/2009   Method used:   Electronically to        [id number]* (retail)       [address] [place], [place]  [address]       Ph: [phone]       Fax: [phone]   RxID:   [id number]   Laboratory Results   Blood Tests   Date/Time Received: September 19, 2009 1:38 PM  Date/Time Reported: September 19, 2009 1:46 PM   HGBA1C: 7.9%   (Normal Range: Non-Diabetic - 3-6%   Control Diabetic - 6-8%)  Comments: ...............test performed by......Marland KitchenBonnie A. [place]az[place], MLS (ASCP)cm       Prevention & Chronic Care Immunizations   Influenza vaccine: Fluvax Non-MCR  (11/01/2008)   Influenza vaccine due: 10/12/2008    Tetanus booster: 05/08/2004: Done.   Tetanus booster due: 05/09/2014     Pneumococcal vaccine: Done.  (06/08/2005)   Pneumococcal vaccine due: None  Colorectal Screening   Hemoccult: Not documented    Colonoscopy: Not documented  Other Screening   Pap smear: NEGATIVE FOR INTRAEPITHELIAL LESIONS OR MALIGNANCY.  (10/20/2007)   Pap smear due: 10/19/2008    Mammogram: normal  (09/05/2007)   Mammogram due: 09/04/2008   Smoking status: never  (09/19/2009)  Diabetes Mellitus   HgbA1C: 7.9  (09/19/2009)   Hemoglobin A1C due: 05/14/2009    Eye exam: No diabetic retinopathy.   [place] eye  (11/29/2008)   Eye exam due: 11/29/2009    Foot exam: yes  (05/16/2009)   Foot exam action/deferral: Do today  High risk foot: Not documented   Foot care education: completed  (07/05/2008)   Foot exam due: 07/05/2009    Urine microalbumin/creatinine ratio: Not documented   Urine microalbumin/cr due: Not Indicated    Diabetes flowsheet reviewed?: Yes   Progress toward A1C goal: Deteriorated  Lipids   Total Cholesterol: 151  (04/26/2008)   LDL: 83  (04/26/2008)   LDL Direct: 73  (07/25/2009)   HDL: 41  (04/26/2008)   Triglycerides: 133  (04/26/2008)    SGOT (AST): 15  (05/16/2009)   SGPT (ALT): 22  (05/16/2009)   Alkaline phosphatase: 57  (05/16/2009)   Total bilirubin: 0.3  (05/16/2009)    Lipid flowsheet reviewed?: Yes   Progress toward LDL goal: At goal  Hypertension   Last Blood Pressure: 135 / 91  (09/19/2009)   Serum creatinine: 0.81  (05/16/2009)   Serum potassium 3.9  (05/16/2009)    Hypertension flowsheet reviewed?: Yes   Progress toward BP goal: Deteriorated  Self-Management Support :   Personal Goals (by the next clinic visit) :     Personal A1C goal: 7  (11/01/2008)     Personal blood pressure goal: 130/80  (11/01/2008)     Personal LDL goal: 100  (11/01/2008)    Patient will work on the following items until the next clinic visit to reach self-care goals:     Medications and monitoring: bring all of my medications to every visit,  examine my feet every day  (11/01/2008)     Eating: eat more vegetables, use fresh or frozen vegetables, eat fruit for snacks and desserts  (09/19/2009)     Activity: take a 30 minute walk every day  (09/19/2009)    Diabetes self-management support: CBG self-monitoring log, Education handout  (09/19/2009)   Diabetes education handout printed   Last diabetes self-management training by diabetes educator: completed    Hypertension self-management support: Not documented    Lipid self-management support: Not documented

## 2010-02-08 NOTE — Miscellaneous (Signed)
Summary: Orders Update  Clinical Lists Changes  Problems: Added new problem of ENCOUNTER FOR LONG-TERM USE OF OTHER MEDICATIONS (ICD-V58.69) Orders: Added new Test order of B12-[facility] (82607-23330) - Signed Added new Test order of CBC-[facility] (85027) - Signed   Ok per Dr. Hale 

## 2010-02-08 NOTE — Assessment & Plan Note (Signed)
Summary: DM, L pelvic pain   Vital Signs:  Patient profile:   55 year old female Menstrual status:  regular Height:      57.5 inches Weight:      119 pounds BMI:     25.40 Temp:     98.1 degrees F oral Pulse rate:   80 / minute BP sitting:   139 / 85  (right arm) Cuff size:   regular  Vitals Entered By: Tessie Fass CMA (February 14, 2009 3:47 PM)  Serial Vital Signs/Assessments:  Time      Position  BP       Pulse  Resp  Temp     By                     138/80                         Eustaquio Boyden  MD  CC: F/U diabetes Is Patient Diabetic? Yes Pain Assessment Patient in pain? no        Primary Care Provider:  Zachery Dauer  CC:  F/U diabetes.  History of Present Illness: CC: f/u DM, HTN, LLQ pain  1. DM - A1c decreased to 7.6% today.  Didn't bring log but keeping track.  Has had 2 fasting cbg's in high 50s, otherwise mostly <100.  A few post prandial >200, around christmas and new years.  Otherwise 150-180s per patient. Had other hypoglycemic events, usually in the night.  Has lost 3 lbs since last visit.  Not exercising, attributes weight loss to vigorous work schedule x 3wks.  Only taking Metformin 1000mg  two times a day.  2. HTN - compliant with meds.  BP today elevated, and on recheck as well, however pt states that her BPs at [employer] on biweekly checks have always been less than 120 sbp.    3. abd/pelvic pain - last week had 3 d history of sharp LLQ pain that was associated with constipation.  No f/c/n/v/d, vag bleeding or d/c, dysuria, or changes in eating habits.  States had this pain 3x in past, previously sent for colposcopy for this pain, told everything was normal.  Hasn't had colonscopy, hasn't had pelvic US, never told she has ovarian cysts.  LMP 2 mo ago.  Not on any form of birth control.  Habits & Providers  Alcohol-Tobacco-Diet     Tobacco Status: never  Current Medications (verified): 1)  Bayer Childrens Aspirin 81 Mg Chew (Aspirin) .... Take 1  Tablet By Mouth Once A Day 2)  Metformin Hcl 1000 Mg Tabs (Metformin Hcl) .... Take One By Mouth Two Times A Day 3)  Simvastatin 40 Mg Tabs (Simvastatin) .... Take 1 Tablet By Mouth At Bedtime 4)  Lisinopril-Hydrochlorothiazide 10-12.5 Mg Tabs (Lisinopril-Hydrochlorothiazide) .Marland Kitchen.. 1 Tablet By Mouth Daily 5)  Therapeutic Multivitamin   Tabs (Multiple Vitamin) .... Take One Tab Daily 6)  Caltrate 600+d 600-400 Mg-Unit  Tabs (Calcium Carbonate-Vitamin D) .... Take One Tablet Twice Daily 7)  Diabeta 5 Mg Tabs (Glyburide) .... Take Two in The Morning and One in The P.m.  Allergies (verified): No Known Drug Allergies  Physical Exam  General:  well appearing, no apparent distress.  Lungs:  Normal respiratory effort, chest expands symmetrically. Lungs are clear to auscultation, no crackles or wheezes. Heart:  Normal rate and regular rhythm. S1 and S2 normal without gallop, murmur, click, rub or other extra sounds. Abdomen:  Bowel sounds positive,abdomen soft without  masses, organomegaly or hernias noted.  + slight LLQ and LUQ tenderness to deep palpation.  no CVA tenderness, able to palpate L kidney s tenderness. Rectal:  No external abnormalities noted. Normal sphincter tone. No rectal masses or tenderness. Soft brown heme neg stool Genitalia:  Normal introitus for age, no external lesions, no vaginal discharge, mucosa pink and moist, no vaginal or cervical lesions, no vaginal atrophy, no friaility or hemorrhage, normal uterus size and position, no adnexal masses or tenderness though slightly tender left pelvis Extremities:  no edema   Impression & Recommendations:  Problem # 1:  DIABETES MELLITUS II, UNCOMPLICATED (ICD-250.00)  Her updated medication list for this problem includes:    Bayer Childrens Aspirin 81 Mg Chew (Aspirin) .Marland Kitchen... Take 1 tablet by mouth once a day    Metformin Hcl 1000 Mg Tabs (Metformin hcl) .Marland Kitchen... Take one by mouth two times a day    Lisinopril-hydrochlorothiazide 10-12.5  Mg Tabs (Lisinopril-hydrochlorothiazide) .Marland Kitchen... 1 tablet by mouth daily    Diabeta 5 Mg Tabs (Glyburide) .Marland Kitchen... Take two in the morning and one in the p.m.  Orders: A1C-[facility] (16109) [facility]- Est Level  3 ([address])  Her updated medication list for this problem includes:    Bayer Childrens Aspirin 81 Mg Chew (Aspirin) .Marland Kitchen... Take 1 tablet by mouth once a day    Metformin Hcl 1000 Mg Tabs (Metformin hcl) .Marland Kitchen... Take 1.5  tablet by mouth in am and 1 in pm    Lisinopril-hydrochlorothiazide 10-12.5 Mg Tabs (Lisinopril-hydrochlorothiazide) .Marland Kitchen... 1 tablet by mouth daily    Diabeta 5 Mg Tabs (Glyburide) .Marland Kitchen... Take two in the morning and one in the p.m.  Problem # 2:  HYPERTENSION, BENIGN SYSTEMIC (ICD-401.1)  Her updated medication list for this problem includes:    Lisinopril-hydrochlorothiazide 10-12.5 Mg Tabs (Lisinopril-hydrochlorothiazide) .Marland Kitchen... 1 tablet by mouth daily  Her updated medication list for this problem includes:    Lisinopril-hydrochlorothiazide 10-12.5 Mg Tabs (Lisinopril-hydrochlorothiazide) .Marland Kitchen... 1 tablet by mouth daily  Orders: [facility]- Est Level  3 (09811)  Problem # 3:  HYPERLIPIDEMIA (ICD-272.4)  continue med.  stable Her updated medication list for this problem includes:    Simvastatin 40 Mg Tabs (Simvastatin) .Marland Kitchen... Take 1 tablet by mouth at bedtime  Labs Reviewed: SGOT: 30 (04/26/2008)   SGPT: 44 (04/26/2008)  Lipid Goals: Chol Goal: 200 (05/03/2008)   HDL Goal: 40 (05/03/2008)   LDL Goal: 100 (05/03/2008)   TG Goal: 150 (05/03/2008)  Prior 10 Yr Risk Heart Disease: 17 % (11/01/2008)   HDL:41 (04/26/2008), 39 (04/14/2007)  LDL:83 (04/26/2008), 72 (04/14/2007)  Chol:151 (04/26/2008), 144 (04/14/2007)  Trig:133 (04/26/2008), 164 (04/14/2007)  Her updated medication list for this problem includes:    Simvastatin 40 Mg Tabs (Simvastatin) .Marland Kitchen... Take 1 tablet by mouth at bedtime  Problem # 4:  PELVIC PAIN, LEFT (ICD-789.09)  Possible low grade diverticulitis. She's to return  when she has another bout, immediately if there is fever or chills Her updated medication list for this problem includes:    Bayer Childrens Aspirin 81 Mg Chew (Aspirin) .Marland Kitchen... Take 1 tablet by mouth once a day  Orders: [facility]- Est Level  3 (91478)  Complete Medication List: 1)  Bayer Childrens Aspirin 81 Mg Chew (Aspirin) .... Take 1 tablet by mouth once a day 2)  Metformin Hcl 1000 Mg Tabs (Metformin hcl) .... Take one by mouth two times a day 3)  Simvastatin 40 Mg Tabs (Simvastatin) .... Take 1 tablet by mouth at bedtime 4)  Lisinopril-hydrochlorothiazide 10-12.5 Mg Tabs (Lisinopril-hydrochlorothiazide) .Marland KitchenMarland KitchenMarland Kitchen  1 tablet by mouth daily 5)  Therapeutic Multivitamin Tabs (Multiple vitamin) .... Take one tab daily 6)  Caltrate 600+d 600-400 Mg-unit Tabs (Calcium carbonate-vitamin d) .... Take one tablet twice daily 7)  Diabeta 5 Mg Tabs (Glyburide) .... Take two in the morning and one in the p.m.  Patient Instructions: 1)  Please schedule a follow-up appointment in 1 month.  2)  Bajar la dosis de Glyburide hasta 2 en la manana y uno en la tarde. 3)  Decrease the dose of Glyburide to 2 in AM and 1 in PM 4)  Tomar 1.5 Metformin en la manana y 1 en la tarde.  5)  Take the Metformin as previously ordered Prescriptions: DIABETA 5 MG TABS (GLYBURIDE) Take two in the morning and one in the P.M.  #90 x 11   Entered and Authorized by:   Zachery Dauer MD   Signed by:   Zachery Dauer MD on 02/14/2009   Method used:   Electronically to        [id number]* (retail)       [address] [place], [place]  [address]       Ph: [phone]       Fax: [phone]   RxID:   [id number]    Prevention & Chronic Care Immunizations   Influenza vaccine: Fluvax Non-MCR  (11/01/2008)   Influenza vaccine due: 10/12/2008    Tetanus booster: 05/08/2004: Done.   Tetanus booster due: 05/09/2014    Pneumococcal vaccine: Done.  (06/08/2005)   Pneumococcal vaccine due: None  Colorectal  Screening   Hemoccult: Not documented    Colonoscopy: Not documented  Other Screening   Pap smear: NEGATIVE FOR INTRAEPITHELIAL LESIONS OR MALIGNANCY.  (10/20/2007)   Pap smear due: 10/19/2008    Mammogram: normal  (09/05/2007)   Mammogram due: 09/04/2008   Smoking status: never  (02/14/2009)  Diabetes Mellitus   HgbA1C: 7.6  (02/14/2009)   Hemoglobin A1C due: 05/14/2009    Eye exam: No diabetic retinopathy.   [place] eye  (11/29/2008)   Eye exam due: 11/29/2009    Foot exam: yes  (11/01/2008)   High risk foot: Not documented   Foot care education: completed  (07/05/2008)   Foot exam due: 07/05/2009    Urine microalbumin/creatinine ratio: Not documented   Urine microalbumin/cr due: Not Indicated    Diabetes flowsheet reviewed?: Yes   Progress toward A1C goal: Improved  Lipids   Total Cholesterol: 151  (04/26/2008)   LDL: 83  (04/26/2008)   LDL Direct: Not documented   HDL: 41  (04/26/2008)   Triglycerides: 133  (04/26/2008)    SGOT (AST): 30  (04/26/2008)   SGPT (ALT): 44  (04/26/2008)   Alkaline phosphatase: 66  (04/26/2008)   Total bilirubin: 0.4  (04/26/2008)    Lipid flowsheet reviewed?: Yes   Progress toward LDL goal: At goal  Hypertension   Last Blood Pressure: 139 / 85  (02/14/2009)   Serum creatinine: 0.71  (04/26/2008)   Serum potassium 5.0  (04/26/2008)    Hypertension flowsheet reviewed?: Yes   Progress toward BP goal: Improved  Self-Management Support :   Personal Goals (by the next clinic visit) :     Personal A1C goal: 7  (11/01/2008)     Personal blood pressure goal: 130/80  (11/01/2008)     Personal LDL goal: 100  (11/01/2008)    Patient will work on the following items until the next clinic visit to reach  self-care goals:     Medications and monitoring: bring all of my medications to every visit, examine my feet every day  (11/01/2008)     Eating: eat more vegetables  (02/14/2009)     Activity: take a 30 minute walk every day   (02/14/2009)    Diabetes self-management support: CBG self-monitoring log, Pre-printed educational material  (11/01/2008)   Last diabetes self-management training by diabetes educator: completed    Hypertension self-management support: Not documented    Lipid self-management support: Not documented   Laboratory Results   Blood Tests   Date/Time Received: February 14, 2009 4:10 PM  Date/Time Reported: February 14, 2009 4:27 PM   HGBA1C: 7.6%   (Normal Range: Non-Diabetic - 3-6%   Control Diabetic - 6-8%)  Comments: ...........test performed by...........Marland KitchenTerese Door, CMA

## 2010-02-08 NOTE — Letter (Signed)
Summary: Results Follow-up Letter  [name]  [address]   [place], [place] [address]   Phone: [phone]  Fax: [phone]    11/16/2009  [address] [place], [place]  [address]  Dear Erin Small,   The following are the results of your recent test(s):  Test     Result     Pap Smear    Normal___x____        Comments: Tests: (2) CBC with Diff (10010)   WBC                       9.0 K/uL                    4.0-10.5   RBC                       4.70 MIL/uL                 3.87-5.11   Hemoglobin                12.2 g/dL                   40.1-02.7   Hematocrit                38.5 %                      36.0-46.0   MCV                       81.9 fL                     78.0-100.0 ! MCH                       26.0 pg                     26.0-34.0   MCHC                      31.7 g/dL                   25.3-66.4   RDW                  [H]  16.2 %                      11.5-15.5   Platelet Count            367 K/uL                    150-400   Granulocyte %             71 %                        43-77   Absolute Gran             6.4 K/uL                    1.7-7.7   Lymph %                   17 %  12-46   Absolute Lymph            1.5 K/uL                    0.7-4.0   Mono %                    9 %                         3-12   Absolute Mono             0.8 K/uL                    0.1-1.0   Eos %                     3 %                         0-5   Absolute Eos              0.3 K/uL                    0.0-0.7   Baso %                    0 %                         0-1   Absolute Baso             0.0 K/uL                    0.0-0.1   Smear Review      No padece de anemia.  Sincerely,  Zachery Dauer MD [facility]            Appended Document: Results Follow-up Letter mailed
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
## 2010-02-08 NOTE — Assessment & Plan Note (Signed)
Summary: F/U diverticulitis KH   Vital Signs:  Patient profile:   55 year old female Menstrual status:  regular Height:      57.5 inches Weight:      112.5 pounds BMI:     24.01 Pulse rate:   75 / minute BP sitting:   156 / 77  (left arm) Cuff size:   regular  Vitals Entered By: Arlyss Repress CMA, (November 28, 2009 1:54 PM) CC: f/up abdominal pain. HTN. Is Patient Diabetic? No Pain Assessment Patient in pain? no        Primary Care Katerina Zurn:  Zachery Dauer  CC:  f/up abdominal pain. HTN.Marland Kitchen  History of Present Illness: Her LLQ pain resolved after a week of antibiotic. No current abnormal bowel movements. No blood in bowel movement.   Highest recent capillary blood glucose was 120, and was 155 at bedtime   blood pressure when taken at the pharmacy are never higher than 120 systolic. She is angry today because her husband didn't bring her because he is waiting for a friend   Still hasn't completed [employer] application.   Interview conducted in Spanish  Habits & Providers  Alcohol-Tobacco-Diet     Tobacco Status: never  Allergies: 1)  Flagyl (Metronidazole) 2)  Cipro (Ciprofloxacin Hcl)  Physical Exam  General:  Well appearing  Lungs:  Normal respiratory effort, chest expands symmetrically. Lungs are clear to auscultation, no crackles or wheezes. Heart:  Normal rate and regular rhythm. S1 and S2 normal without gallop, murmur, click, rub or other extra sounds. Abdomen:  Bowel sounds positive,abdomen soft and non-tender without masses, organomegaly or hernias noted.    Impression & Recommendations:  Problem # 1:  DIVERTICULITIS OF COLON (ICD-562.11) Resolved after antibiotic course. I recommended a high fiber diet. She is to call when she has Rudell Cobb assistance and I will schedule a barium enema.  Orders: [facility]- Est Level  3 ([id number])  Problem # 2:  DIABETES MELLITUS, TYPE II, CONTROLLED, MILD (ICD-250.00) Good control by home glucoses. She is thin so may  become insulin requiring Her updated medication list for this problem includes:    Bayer Childrens Aspirin 81 Mg Chew (Aspirin) .Marland Kitchen... Take 1 tablet by mouth once a day    Metformin Hcl 1000 Mg Tabs (Metformin hcl) .Marland Kitchen... Take one by mouth two times a day    Lisinopril-hydrochlorothiazide 10-12.5 Mg Tabs (Lisinopril-hydrochlorothiazide) .Marland Kitchen... 1 tablet by mouth two times a day    Diabeta 5 Mg Tabs (Glyburide) .Marland Kitchen... Take two in the morning and two in the p.m.  Problem # 3:  HYPERTENSION, BENIGN SYSTEMIC (ICD-401.1) Not well controlled by office testing, but normal blood pressures by report. Emphasized sodium restriction Her updated medication list for this problem includes:    Lisinopril-hydrochlorothiazide 10-12.5 Mg Tabs (Lisinopril-hydrochlorothiazide) .Marland Kitchen... 1 tablet by mouth two times a day  Orders: [facility]- Est Level  3 (29562)  Complete Medication List: 1)  Bayer Childrens Aspirin 81 Mg Chew (Aspirin) .... Take 1 tablet by mouth once a day 2)  Metformin Hcl 1000 Mg Tabs (Metformin hcl) .... Take one by mouth two times a day 3)  Simvastatin 40 Mg Tabs (Simvastatin) .... Take 2 tablets by mouth at bedtime 4)  Lisinopril-hydrochlorothiazide 10-12.5 Mg Tabs (Lisinopril-hydrochlorothiazide) .Marland Kitchen.. 1 tablet by mouth two times a day 5)  Caltrate 600+d 600-400 Mg-unit Tabs (Calcium carbonate-vitamin d) .... Take one tablet twice daily 6)  Diabeta 5 Mg Tabs (Glyburide) .... Take two in the morning and two in the p.m.  Patient Instructions: 1)  Please schedule a follow-up appointment in 1 month.  2)  Ponga fibra en la dieta para Actor suave.    Orders Added: 1)  [facility]- Est Level  3 [16109]

## 2010-02-08 NOTE — Letter (Signed)
Summary: Generic Letter  [facility]  [address]   [place], [place] [address]   Phone: [phone]  Fax: [phone]    05/19/2009  Marcelle Crays [address] [place], [place]  [address]  Barron Schmid,   Fue un placer verle en el consultorio en esta semana. Como hablamos hoy por telefono, el nivel del colesterol malo (LDL) estaba alto.  Recomiendo que aumentes la simvastatina a 80mg  por dia (dos tabletas de las de 40mg  que esta' tomando ahora).  Quisiera volver a Scientist, research (life sciences) de nuevo (no requiere que ayune) en 2 meses (despues del 4 de julio).     Por favor llame si tiene preguntas.      Sinceramente,     Paula Compton MD  Appended Document: Generic Letter mailed.

## 2010-02-19 ENCOUNTER — Other Ambulatory Visit: Payer: Self-pay | Admitting: Family Medicine

## 2010-02-19 NOTE — Telephone Encounter (Signed)
Refill request

## 2010-02-22 ENCOUNTER — Other Ambulatory Visit: Payer: Self-pay | Admitting: Family Medicine

## 2010-02-22 ENCOUNTER — Telehealth: Payer: Self-pay | Admitting: *Deleted

## 2010-02-22 MED ORDER — GLYBURIDE 5 MG PO TABS: 5.0000 mg | ORAL_TABLET | Freq: Three times a day (TID) | ORAL | Status: DC

## 2010-02-22 NOTE — Telephone Encounter (Signed)
Please review and refill

## 2010-02-22 NOTE — Telephone Encounter (Signed)
Patient called on the  Hispanic line requesting refill on Diabeta  5 mg. States she has called pharmacy and they have sent [place] refill request but we have not responded. Will forward message to  Dr. Sheffield Slider. Pharmacy is [facility].

## 2010-02-23 NOTE — Telephone Encounter (Signed)
It was sent

## 2010-02-27 ENCOUNTER — Ambulatory Visit (INDEPENDENT_AMBULATORY_CARE_PROVIDER_SITE_OTHER): Payer: Self-pay | Admitting: Family Medicine

## 2010-02-27 ENCOUNTER — Encounter: Payer: Self-pay | Admitting: Family Medicine

## 2010-02-27 DIAGNOSIS — I10 Essential (primary) hypertension: Secondary | ICD-10-CM

## 2010-02-27 DIAGNOSIS — E119 Type 2 diabetes mellitus without complications: Secondary | ICD-10-CM

## 2010-02-27 DIAGNOSIS — E785 Hyperlipidemia, unspecified: Secondary | ICD-10-CM

## 2010-02-27 MED ORDER — GLYBURIDE 5 MG PO TABS: 10.0000 mg | ORAL_TABLET | Freq: Two times a day (BID) | ORAL | Status: DC

## 2010-02-27 MED ORDER — LISINOPRIL-HYDROCHLOROTHIAZIDE 10-12.5 MG PO TABS: 1.0000 | ORAL_TABLET | Freq: Two times a day (BID) | ORAL | Status: DC

## 2010-02-27 NOTE — Patient Instructions (Signed)
Fue un Research officer, trade union.  Mande' la receta de la glyburide 5mg , 120 tabletas, a la farmacia de [employer].   Quiero que venga al laboratorio en [place], el viernes Marzo 02 a las 8:30am para Hydrographic surveyor.  Le llamo con el resultado al [phone].  MAKE APPOINTMENT FOR FASTING LABS ONLY ON Friday MARCH 2ND AT 830AM.

## 2010-02-27 NOTE — Assessment & Plan Note (Signed)
Manual check today is at goal.  Continue meds as ordered.

## 2010-02-27 NOTE — Progress Notes (Signed)
  Subjective:    Patient ID: Erin Small, female    DOB: 1955-05-08, 55 y.o.   MRN: [id number]  HPI Visit conducted in Spanish. [name] is here for follow-up of her diabetes and hypertension.  She reports that she feels well, her only complaint has been that she is taking glyburide 5mg , four tablets daily (2 tabs BID), and ran out early.  Saw eye doctor (doesn't recall his name) for dilated eye exam in Nov 2011, which was normal, told for 1 yr follow up.    Review of Systems Denies chest pain, shortness of breath, fevers.  LMP 2-3 months ago, lighter than usual.  Suspects she is undergoing menopause.  No weight changes.  No changes in bowel habits.    Objective:   Physical Exam  Constitutional: She appears well-developed and well-nourished. No distress.  HENT:  Head: Normocephalic and atraumatic.  Mouth/Throat: Oropharynx is clear and moist.  Eyes: Conjunctivae are normal. Pupils are equal, round, and reactive to light. Right eye exhibits no discharge. Left eye exhibits no discharge.  Neck: No tracheal deviation present. No thyromegaly present.  Cardiovascular: Normal rate, regular rhythm, normal heart sounds and intact distal pulses.   Pulmonary/Chest: Effort normal and breath sounds normal. No respiratory distress. She has no wheezes. She has no rales.  Lymphadenopathy:    She has no cervical adenopathy.  Neurological: She is alert.       Diabetic foot exam with monofilament unremarkable (normal sensation in all areas of both feet). No maceration of interdigit.skin. No edema.  Palpable dp pulses bilaterally  Skin: She is not diaphoretic.          Assessment & Plan:

## 2010-02-27 NOTE — Assessment & Plan Note (Signed)
Last A1C 7.9% on Sept 12, 2011.  FOr recheck with other labs, ordered for future orders.  Meds refilled.

## 2010-02-27 NOTE — Assessment & Plan Note (Signed)
Had increase in simva dose last May 2011 when her LDL went up to 130s.  Was adequately controlled with increased dose, which she has tolerated well. For recheck of complete lipid panel (fasting) and CMet.  WIll call her with results.

## 2010-03-10 ENCOUNTER — Other Ambulatory Visit (INDEPENDENT_AMBULATORY_CARE_PROVIDER_SITE_OTHER): Payer: Self-pay

## 2010-03-10 DIAGNOSIS — E119 Type 2 diabetes mellitus without complications: Secondary | ICD-10-CM

## 2010-03-10 LAB — CONVERTED CEMR LAB
AST: 23 units/L (ref 0–37)
Albumin: 5.1 g/dL (ref 3.5–5.2)
BUN: 19 mg/dL (ref 6–23)
Calcium: 9.5 mg/dL (ref 8.4–10.5)
Chloride: 99 meq/L (ref 96–112)
Glucose, Bld: 118 mg/dL — ABNORMAL HIGH (ref 70–99)
HDL: 45 mg/dL (ref >39)
Potassium: 4.8 meq/L (ref 3.5–5.3)

## 2010-03-10 LAB — LIPID PANEL
Cholesterol: 171 mg/dL (ref 0–200)
HDL: 45 mg/dL (ref >39)
Total CHOL/HDL Ratio: 3.8 Ratio
Triglycerides: 175 mg/dL — ABNORMAL HIGH (ref <150)
VLDL: 35 mg/dL (ref 0–40)

## 2010-03-10 LAB — COMPREHENSIVE METABOLIC PANEL
AST: 23 U/L (ref 0–37)
Alkaline Phosphatase: 53 U/L (ref 39–117)
BUN: 19 mg/dL (ref 6–23)
Calcium: 9.5 mg/dL (ref 8.4–10.5)
Creat: 0.77 mg/dL (ref 0.40–1.20)

## 2010-03-13 ENCOUNTER — Encounter: Payer: Self-pay | Admitting: Family Medicine

## 2010-03-20 ENCOUNTER — Other Ambulatory Visit: Payer: Self-pay

## 2010-03-21 ENCOUNTER — Other Ambulatory Visit: Payer: Self-pay | Admitting: Family Medicine

## 2010-03-21 NOTE — Telephone Encounter (Signed)
Refill request

## 2010-04-24 IMAGING — MG MM DIGITAL SCREENING BILAT
7 series · 7 of 7 positions shown · non-contrast
Comparison: none

DG SCREEN MAMMOGRAM BILATERAL
Bilateral CC and MLO view(s) were taken.

DIGITAL SCREENING MAMMOGRAM WITH CAD:
The breast tissue is heterogeneously dense.  No masses or malignant type calcifications are 
identified.  Compared with prior studies.

[R CC (1 of 2)]
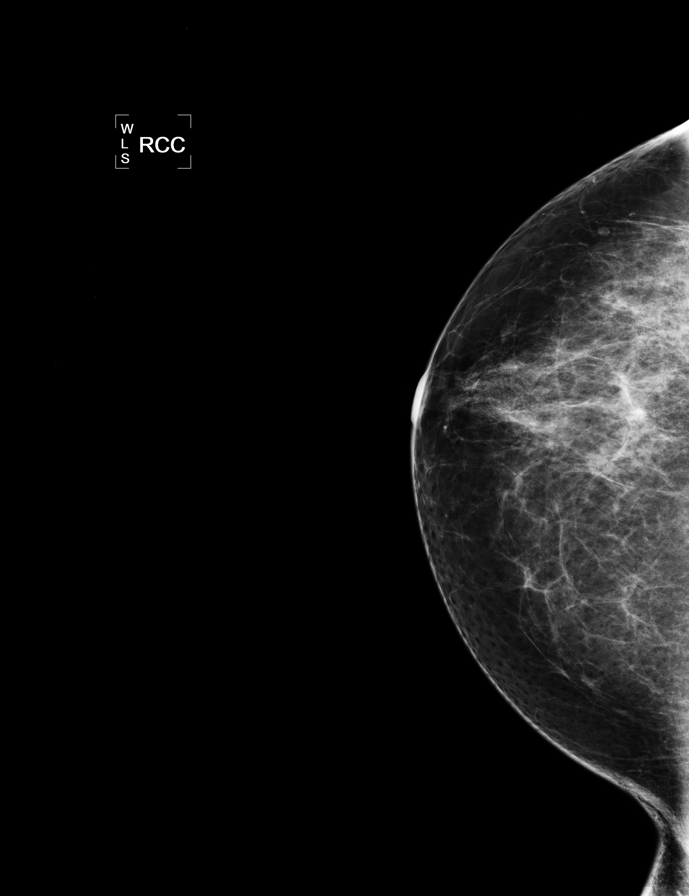

[R MLO (1 of 2)]
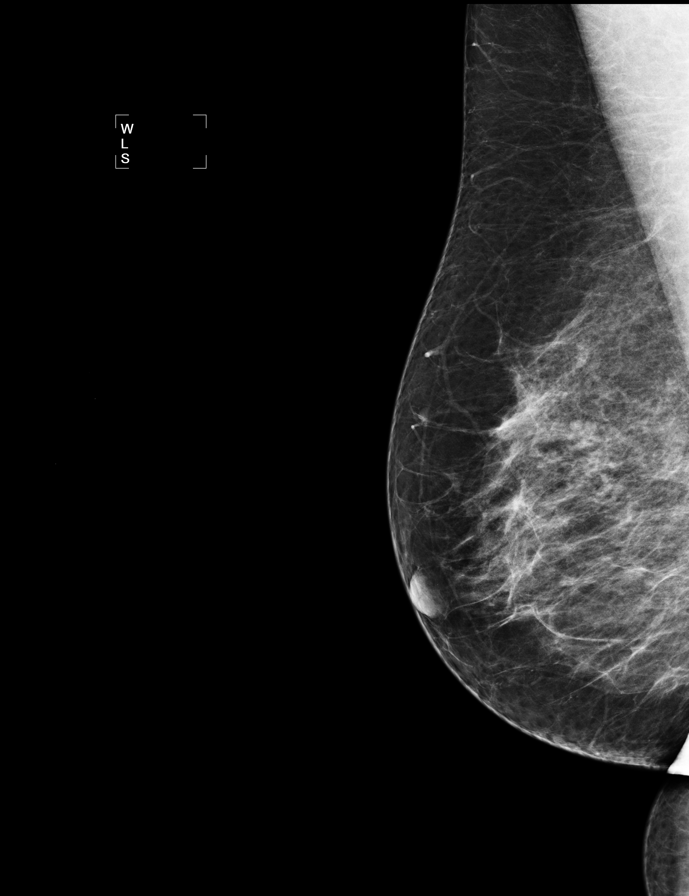

[L CC]
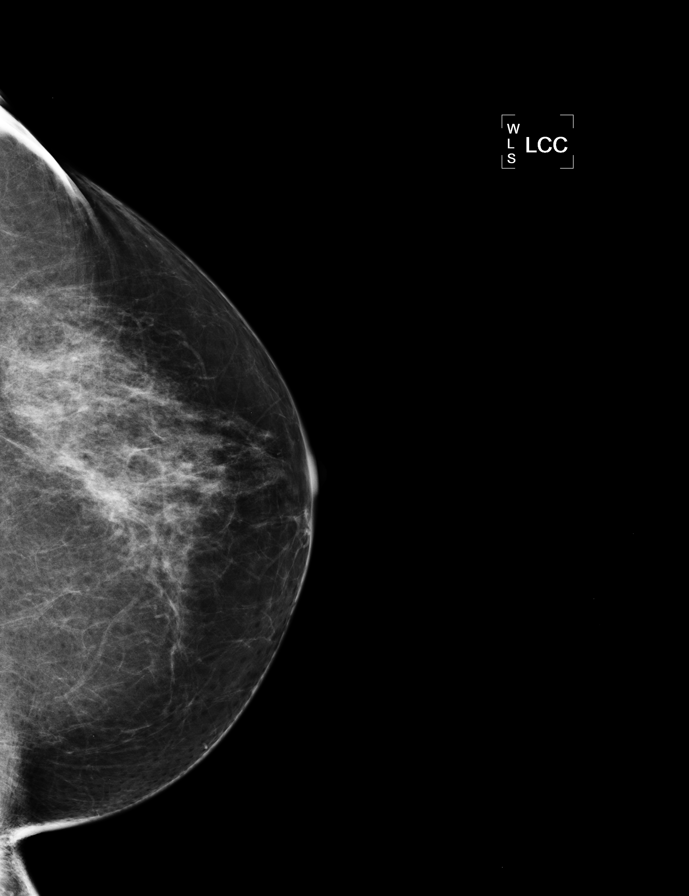

[L MLO (1 of 2)]
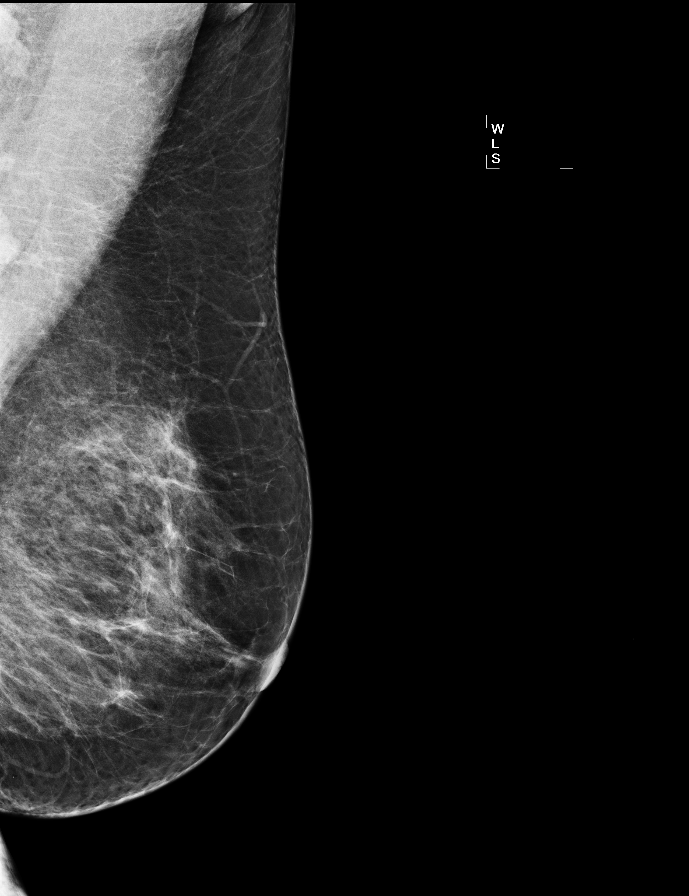

[R CC (2 of 2)]
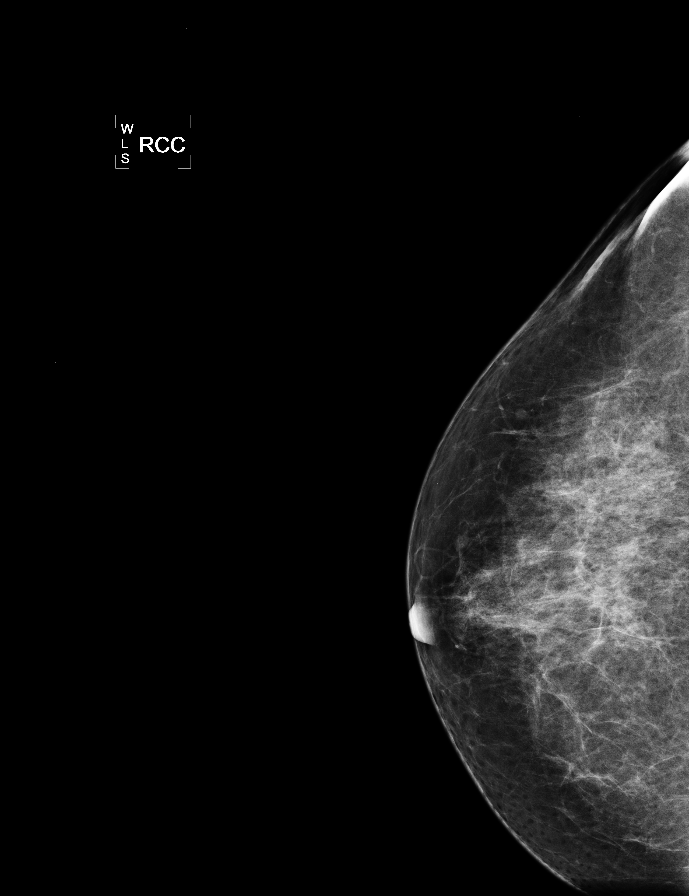

[L MLO (2 of 2)]
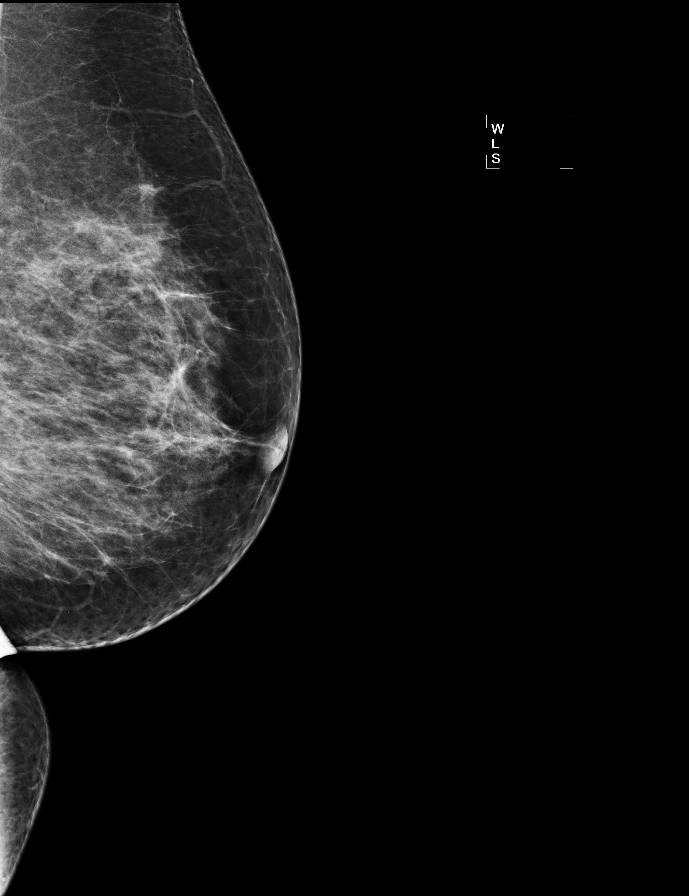

[R MLO (2 of 2)]
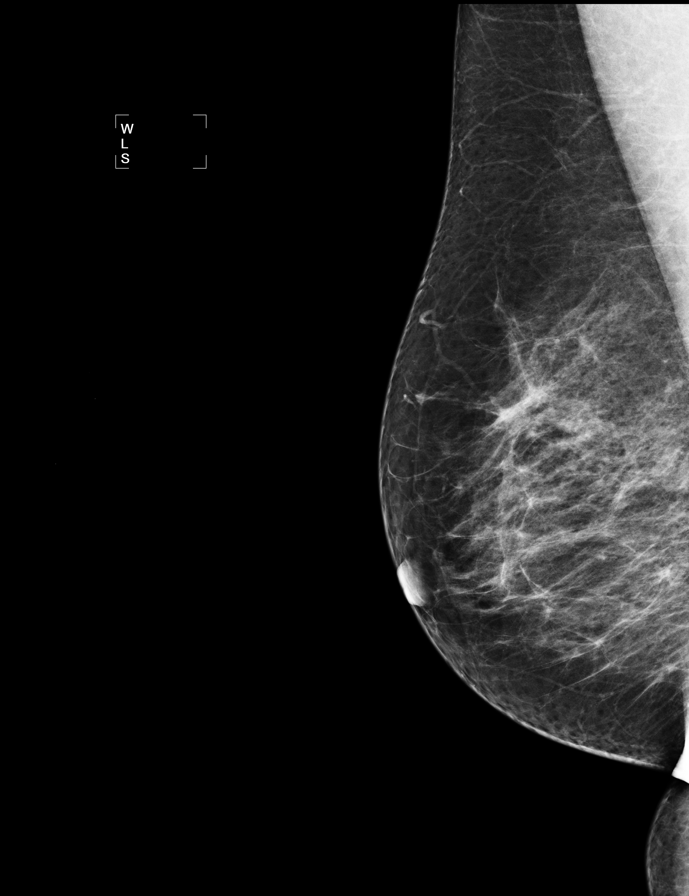

[7 of 7 positions shown; findings below may reference images not displayed]

IMPRESSION: No specific mammographic evidence of malignancy.  Next screening mammogram is recommended in one 
year.

ASSESSMENT: Negative - BI-RADS 1

Screening mammogram in 1 year.
ANALYZED BY COMPUTER AIDED DETECTION. , THIS PROCEDURE WAS A DIGITAL MAMMOGRAM.

## 2010-06-13 ENCOUNTER — Encounter: Payer: Self-pay | Admitting: Family Medicine

## 2010-06-13 ENCOUNTER — Ambulatory Visit (INDEPENDENT_AMBULATORY_CARE_PROVIDER_SITE_OTHER): Payer: Self-pay | Admitting: Family Medicine

## 2010-06-13 VITALS — BP 132/84 | HR 86 | Temp 98.6°F | Ht <= 58 in | Wt 113.7 lb

## 2010-06-13 DIAGNOSIS — B351 Tinea unguium: Secondary | ICD-10-CM

## 2010-06-13 DIAGNOSIS — E119 Type 2 diabetes mellitus without complications: Secondary | ICD-10-CM

## 2010-06-13 DIAGNOSIS — J069 Acute upper respiratory infection, unspecified: Secondary | ICD-10-CM

## 2010-06-13 DIAGNOSIS — I10 Essential (primary) hypertension: Secondary | ICD-10-CM

## 2010-06-13 LAB — POCT GLYCOSYLATED HEMOGLOBIN (HGB A1C): Hemoglobin A1C: 8.7

## 2010-06-13 MED ORDER — METFORMIN HCL 1000 MG PO TABS: ORAL_TABLET | ORAL | Status: DC

## 2010-06-13 NOTE — Patient Instructions (Signed)
La A1C 8.7 esta mas alta que la meta de 7.0  La presion esta 132/84 meta menos de 130/80  Aumente la metformin hasta 1.5 en la manana y 1 en la tarde.  Return in 1 month para otra chequeo.

## 2010-06-13 NOTE — Progress Notes (Signed)
  Subjective:    Patient ID: Erin Small, female    DOB: Jul 26, 1955, 55 y.o.   MRN: [id number]  HPI 5 days of cough and sore throat minimal nasal congestion. Her left ear was was painful but that has resolved. No fever. No chest pain or shortness of breath. She's been taking cough syrup and lozenges with sugar.  Her blood sugars have been higher when tested. She says often over 200. Only 2 hypoglycemic episodes which happened around 4 PM. No numbness of feet. 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   Says her sexual function is fine with her husband, except when she isn't feeling well. Like now with this cold.   Interview conducted in Spanish  Review of Systems  Constitutional: Negative for fever and chills.  HENT: Positive for ear pain. Negative for congestion and rhinorrhea.   Respiratory: Negative for shortness of breath.   Cardiovascular: Negative for chest pain.  Gastrointestinal: Negative.        Objective:   Physical Exam normal weight Hispanic female with minimal abdominal obesity Nose normal ears TMs normal mouth and pharynx normal chest is clear heart regular rhythm without murmur feet have no abnormalities of the nails. No calluses sensation is normal to filament testing        Assessment & Plan:

## 2010-06-14 ENCOUNTER — Encounter: Payer: Self-pay | Admitting: Family Medicine

## 2010-06-14 DIAGNOSIS — J069 Acute upper respiratory infection, unspecified: Secondary | ICD-10-CM | POA: Insufficient documentation

## 2010-06-14 NOTE — Assessment & Plan Note (Signed)
Worsened control. Says she will follow diet better. She is relatively thin and may require insulin for control. Will maximize Metformin

## 2010-06-14 NOTE — Assessment & Plan Note (Signed)
Fairly well controlled 

## 2010-06-14 NOTE — Assessment & Plan Note (Signed)
Mild with no indication of extension into chest.

## 2010-06-14 NOTE — Assessment & Plan Note (Signed)
Minimal at this time.  

## 2010-07-17 ENCOUNTER — Other Ambulatory Visit: Payer: Self-pay | Admitting: Family Medicine

## 2010-07-17 NOTE — Telephone Encounter (Signed)
Refill request

## 2010-07-25 ENCOUNTER — Ambulatory Visit: Payer: Self-pay | Admitting: Family Medicine

## 2010-10-14 ENCOUNTER — Other Ambulatory Visit: Payer: Self-pay | Admitting: Family Medicine

## 2010-10-14 DIAGNOSIS — I10 Essential (primary) hypertension: Secondary | ICD-10-CM

## 2010-10-15 NOTE — Telephone Encounter (Signed)
Refill request

## 2010-12-25 ENCOUNTER — Ambulatory Visit (INDEPENDENT_AMBULATORY_CARE_PROVIDER_SITE_OTHER): Payer: Self-pay | Admitting: Family Medicine

## 2010-12-25 ENCOUNTER — Encounter: Payer: Self-pay | Admitting: Family Medicine

## 2010-12-25 VITALS — BP 120/84 | HR 91 | Ht <= 58 in | Wt 115.8 lb

## 2010-12-25 DIAGNOSIS — E119 Type 2 diabetes mellitus without complications: Secondary | ICD-10-CM

## 2010-12-25 DIAGNOSIS — E785 Hyperlipidemia, unspecified: Secondary | ICD-10-CM

## 2010-12-25 DIAGNOSIS — I10 Essential (primary) hypertension: Secondary | ICD-10-CM

## 2010-12-25 LAB — BASIC METABOLIC PANEL
CO2: 27 mEq/L (ref 19–32)
Calcium: 10.4 mg/dL (ref 8.4–10.5)
Creat: 0.84 mg/dL (ref 0.50–1.10)
Glucose, Bld: 86 mg/dL (ref 70–99)

## 2010-12-25 NOTE — Progress Notes (Signed)
S:  Pt is here for diabetic follow up:  DM: Checking CBGs?:yes How Often?:mainly twice daily  Blood Sugar Range:140s-180s. 180s maily after meals  Polyuria, polydypsia, polyphagia:no Symptomatic Hypoglycemia:no  Medication Compliance?:yes ACE if hypertensive/obesity?:yes Statin if LDL >100?:yes  She hasn't been exercising since she went to [place] where she visited family and gained weight through not being careful with her diet. She is resistant to starting insulin. She is willing to start regular walking in a park near her home. A problem is that she works until 5 and her husband can't walk with her due to his prior leg injuries.  O:  Current outpatient prescriptions:aspirin (BAYER CHILDRENS ASPIRIN) 81 MG chewable tablet, Chew 81 mg by mouth daily.  , Disp: , Rfl: ;  Calcium Carbonate-Vitamin D (CALTRATE 600+D) 600-400 MG-UNIT per tablet, Take 1 tablet by mouth 2 (two) times daily.  , Disp: , Rfl: ;  glyBURIDE (DIABETA) 5 MG tablet, TAKE 2 TABLETS BY MOUTH IN THE MORNING AND 1 TABLET BY MOUTH IN THE EVENING, Disp: 90 tablet, Rfl: 0 lisinopril-hydrochlorothiazide (PRINZIDE,ZESTORETIC) 10-12.5 MG per tablet, TAKE 1 TABLET BY MOUTH TWICE DAILY, Disp: 180 tablet, Rfl: 2;  metFORMIN (GLUCOPHAGE) 1000 MG tablet, 1.5 tablet in the morning and 1 in the evening, Disp: 75 tablet, Rfl: 11;  simvastatin (ZOCOR) 40 MG tablet, TAKE 2 TABLETS BY MOUTH AT BEDTIME, Disp: 180 tablet, Rfl: 1  Wt Readings from Last 3 Encounters:  12/25/10 115 lb 12.8 oz (52.527 kg)  06/13/10 113 lb 11.2 oz (51.574 kg)  02/27/10 114 lb (51.71 kg)   Temp Readings from Last 3 Encounters:  06/13/10 98.6 F (37 C) Oral   BP Readings from Last 3 Encounters:  12/25/10 120/84  06/13/10 132/84  02/27/10 136/80   Pulse Readings from Last 3 Encounters:  12/25/10 91  06/13/10 86  02/27/10 81    General: alert and cooperative HEENT: PERRLA and extra ocular movement intact Heart: S1, S2 normal, no murmur, rub or gallop,  regular rate and rhythm Lungs: clear to auscultation, no wheezes or rales and unlabored breathing Abdomen: abdomen is soft without significant tenderness, masses, organomegaly or guarding Extremities: extremities normal, atraumatic, no cyanosis or edema Skin:no rashes Neurology: normal without focal findings, mental status, speech normal, alert and oriented x3, PERLA, reflexes normal and symmetric and normal monofilament testing bilaterally   A/P:

## 2010-12-25 NOTE — Assessment & Plan Note (Addendum)
Broached issue of need for insulin in setting of persistent A1Cs > 8.3. A1C 9.0 today. Had a relatively lengthy discussion about this. Pt states that she does not desire insuline and would like to focus more on diet and exercise. Plan to set up pt for nutrition counseling. Will continue on current regimen pending follow up in 03/2010.  FLP will also be due at that visit.  And she will walk for 3 days a week for 20 minutes, increase one minutes he week with a goal of walking over 30 minutes to burn fat. She also is to gradually increase to 4-5 days weekly

## 2010-12-25 NOTE — Progress Notes (Signed)
Interpreter Wyvonnia Dusky for Dr Alvester Morin 14.15

## 2010-12-25 NOTE — Assessment & Plan Note (Signed)
well controlled  

## 2010-12-25 NOTE — Patient Instructions (Signed)
Diabetes y Doroteo Glassman fsica (Diabetes and Exercise) La actividad fsica regular es muy importante y [place] a:   Chief Operating Officer el nivel de glucosa en sangre (azcar).   Disminuir la presin arterial.   Controlar el colesterol en sangre (colesterol y triglicridos).   Mejorar el estado de salud general.  BENEFICIOS DE LA ACTIVIDAD FSICA  Mejora el buen estado fsico.   Mejora la flexibilidad.   Aumenta la resistencia.   Aumenta la densidad sea.   Favorece el control del peso.   Aumenta la fuerza muscular.   Disminuye la Art gallery manager.   Mejora la utilizacin de la insulina por parte del organismo.   Aumenta la sensibilidad a la insulina.   Reduce las necesidades de insulina.   Har que se sienta mejor.   Reduce el estrs y las tensiones.  Las personas diabticas que incorporan la actividad fsica a su estilo de vida obtienen beneficios adicionales.  Prdida de peso.   Reduccin del apetito.   Mejora la utilizacin de la glucosa por parte del organismo.   Disminuye los factores de riesgo para las enfermedades cardacas.   Disminuye el colesterol y los triglicridos.   Eleva el nivel de colesterol bueno (lipoproteinas de alta densidad HDL).   Disminuye el nivel de Banker.   Disminuye la presin arterial.  DIABETES TIPO I Y ACTIVIDAD FSICA  La actividad fsica disminuir el nivel de glucosa en [place].   Si el nivel de glucosa en sangre es de ms de 240 mg/dl, controle las cetonas en la [place]. Si hay cetonas, no realice actividad fisica.   El sitio de inyeccin de la insulina puede requerir un ajuste cuando se realiza actividad fsica. Evite inyectarse insulina en las zonas del cuerpo que ejercitar. Por ejemplo, evite inyectarse insulina en:   Los brazos, si juega al tenis.   Las piernas, si corre. Para obtener ms informacion, consulte a su mdico.   Lleve un registro de:   La ingesta de alimentos.   El tipo y cantidad de [place].     Los momentos esperables de picos de accin de la insulina.   Los niveles de Event organiser.  Hgalo antes, durante y despus de Printmaker actividad fsica. Verifique los registros junto con su mdico. Esto ser de utilidad para la confeccin de pautas para ajustar la ingesta de alimentos o las cantidades de [place].  DIABETES TIPO 2 Y ACTIVIDAD FSICA  La actividad fsica regular ayuda a controlar el nivel de glucosa en [place].   La actividad fsica es importante porque:   Aumenta la sensibilidad del organismo a la insulina.   Mejora el control del nivel de Event organiser.   Reduce el riesgo de enfermedades cardiacas. Disminuye el colesterol srico y los triglicridos. Disminuye la presin arterial.   Las personas que reciben insulina o agentes hipoglucemiantes por va oral deben controlar la aparicin de signos de hipoglucemia (mareos, temblores, sudoracin, escalofros y confusin).   Durante la actividad fsica se pierde Data processing manager. Esta prdida de lquidos debe reponerse. De este modo se evita la prdida de lquidos corporales (deshidratacin) y el golpe de Airline pilot.  Comente con su mdico antes de comenzar un programa de actividad fsica para verificar que sea seguro para usted. Recuerde, cualquier actividad es mejor que ninguna.  Document Released: 01/14/2007 Document Revised: 09/06/2010 [facility] Patient Information 2012 [place], [place].

## 2010-12-27 ENCOUNTER — Encounter: Payer: Self-pay | Admitting: Family Medicine

## 2011-02-26 ENCOUNTER — Other Ambulatory Visit: Payer: Self-pay | Admitting: Family Medicine

## 2011-02-26 DIAGNOSIS — E785 Hyperlipidemia, unspecified: Secondary | ICD-10-CM

## 2011-02-26 MED ORDER — SIMVASTATIN 40 MG PO TABS: 80.0000 mg | ORAL_TABLET | Freq: Every day | ORAL | Status: DC

## 2011-02-26 NOTE — Telephone Encounter (Signed)
Refill request.  Sent in  one month supply yesterday.

## 2011-02-26 NOTE — Telephone Encounter (Signed)
Refill request

## 2011-02-26 NOTE — Telephone Encounter (Signed)
Addended by: Orvil Feil L on: 02/26/2011 03:18 PM   Modules accepted: Orders

## 2011-03-10 ENCOUNTER — Other Ambulatory Visit: Payer: Self-pay | Admitting: Family Medicine

## 2011-03-10 DIAGNOSIS — E119 Type 2 diabetes mellitus without complications: Secondary | ICD-10-CM

## 2011-03-11 NOTE — Telephone Encounter (Signed)
Refill request

## 2011-03-26 ENCOUNTER — Other Ambulatory Visit: Payer: Self-pay | Admitting: Family Medicine

## 2011-03-26 MED ORDER — GLYBURIDE 5 MG PO TABS: 10.0000 mg | ORAL_TABLET | Freq: Two times a day (BID) | ORAL | Status: DC

## 2011-03-26 NOTE — Telephone Encounter (Signed)
Encounter created in error

## 2011-03-26 NOTE — Telephone Encounter (Signed)
Refill request.  Similar refill request also sent to Dr. Mauricio Po

## 2011-03-26 NOTE — Telephone Encounter (Signed)
Refill request

## 2011-03-26 NOTE — Telephone Encounter (Signed)
REFILL REQUEST

## 2011-03-26 NOTE — Telephone Encounter (Signed)
Received fax from pharmacy about refill on Glyburide 5 mg. Faxed  RX states two tabs twice daily but in chart it states two  in AM and one in PM with meals.  Dr. Leveda Anna consulted and he advises for patient to take two tabs twice daily

## 2011-03-27 NOTE — Telephone Encounter (Signed)
Refill request-Dr. Hale on vacation 

## 2011-03-27 NOTE — Telephone Encounter (Signed)
This refill has been done . See next refill note

## 2011-03-27 NOTE — Telephone Encounter (Signed)
Appointment scheduled for 04/17/2011 for diabetes follow up.  Resent in RX because patient requested #90 days supply.

## 2011-04-17 ENCOUNTER — Encounter: Payer: Self-pay | Admitting: Family Medicine

## 2011-04-17 ENCOUNTER — Ambulatory Visit (INDEPENDENT_AMBULATORY_CARE_PROVIDER_SITE_OTHER): Payer: Self-pay | Admitting: Family Medicine

## 2011-04-17 ENCOUNTER — Other Ambulatory Visit: Payer: Self-pay | Admitting: Family Medicine

## 2011-04-17 VITALS — BP 124/76 | HR 93 | Ht <= 58 in | Wt 115.0 lb

## 2011-04-17 DIAGNOSIS — E785 Hyperlipidemia, unspecified: Secondary | ICD-10-CM

## 2011-04-17 DIAGNOSIS — E119 Type 2 diabetes mellitus without complications: Secondary | ICD-10-CM

## 2011-04-17 DIAGNOSIS — I10 Essential (primary) hypertension: Secondary | ICD-10-CM

## 2011-04-17 NOTE — Progress Notes (Signed)
  Subjective:    Patient ID: Erin Small, female    DOB: 05-08-1955, 56 y.o.   MRN: [id number]  HPIShe hasn't been exercising regularly. 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 Seen at urgent care and improved on anti-inflammatory muscle.   Fasting cbg this AM was 125. Is only taking the Metformin 1000 mg 1 bid. Continues Glyburide. Felt low in her blood sugar last night.   Husband was out of work with a right shoulder injury, but is now improved.   No chest pain or shortness of breath No problems with her feet.   Review of Systems     Objective:   Physical Exam  Cardiovascular: Normal rate and regular rhythm.   Pulmonary/Chest: Effort normal and breath sounds normal.  Skin: No rash noted.          Assessment & Plan:

## 2011-04-17 NOTE — Assessment & Plan Note (Signed)
well controlled  

## 2011-04-17 NOTE — Patient Instructions (Signed)
Aumente la dosis de Metformin 1000 mg hasta una y la mitad en la manana y uno en la tarde.   Regrese en ayunas para prueba del colesterol   Schedule a fasting lab visit  Please return to see Dr Sheffield Slider in 3months. Regrese en 3 meses.

## 2011-04-17 NOTE — Assessment & Plan Note (Signed)
Not adequately controlled Increase Metformin to total of 2500, which should have happened last time

## 2011-04-19 ENCOUNTER — Other Ambulatory Visit: Payer: Self-pay

## 2011-04-19 DIAGNOSIS — E785 Hyperlipidemia, unspecified: Secondary | ICD-10-CM

## 2011-04-19 DIAGNOSIS — I10 Essential (primary) hypertension: Secondary | ICD-10-CM

## 2011-04-19 LAB — COMPREHENSIVE METABOLIC PANEL
Alkaline Phosphatase: 56 U/L (ref 39–117)
BUN: 18 mg/dL (ref 6–23)
Glucose, Bld: 98 mg/dL (ref 70–99)
Sodium: 139 mEq/L (ref 135–145)
Total Bilirubin: 0.4 mg/dL (ref 0.3–1.2)
Total Protein: 7 g/dL (ref 6.0–8.3)

## 2011-04-19 LAB — LIPID PANEL
HDL: 44 mg/dL (ref >39)
LDL Cholesterol: 72 mg/dL (ref 0–99)
Triglycerides: 127 mg/dL (ref <150)
VLDL: 25 mg/dL (ref 0–40)

## 2011-04-19 NOTE — Progress Notes (Signed)
cmp and flp done today Anthonymichael Munday 

## 2011-04-22 ENCOUNTER — Other Ambulatory Visit: Payer: Self-pay | Admitting: Family Medicine

## 2011-05-29 ENCOUNTER — Other Ambulatory Visit: Payer: Self-pay | Admitting: Family Medicine

## 2011-06-24 ENCOUNTER — Other Ambulatory Visit: Payer: Self-pay | Admitting: Family Medicine

## 2011-07-06 ENCOUNTER — Other Ambulatory Visit: Payer: Self-pay | Admitting: Family Medicine

## 2011-09-21 ENCOUNTER — Other Ambulatory Visit: Payer: Self-pay | Admitting: Family Medicine

## 2011-09-25 ENCOUNTER — Ambulatory Visit (INDEPENDENT_AMBULATORY_CARE_PROVIDER_SITE_OTHER): Payer: Self-pay | Admitting: Family Medicine

## 2011-09-25 ENCOUNTER — Encounter: Payer: Self-pay | Admitting: Family Medicine

## 2011-09-25 VITALS — BP 127/84 | HR 75 | Temp 98.6°F | Ht <= 58 in | Wt 112.9 lb

## 2011-09-25 DIAGNOSIS — I10 Essential (primary) hypertension: Secondary | ICD-10-CM

## 2011-09-25 DIAGNOSIS — E119 Type 2 diabetes mellitus without complications: Secondary | ICD-10-CM

## 2011-09-25 DIAGNOSIS — Z23 Encounter for immunization: Secondary | ICD-10-CM

## 2011-09-25 LAB — POCT GLYCOSYLATED HEMOGLOBIN (HGB A1C): Hemoglobin A1C: 9.5

## 2011-09-25 MED ORDER — PIOGLITAZONE HCL 15 MG PO TABS: 15.0000 mg | ORAL_TABLET | Freq: Every day | ORAL | Status: DC

## 2011-09-25 MED ORDER — LISINOPRIL-HYDROCHLOROTHIAZIDE 10-12.5 MG PO TABS: 2.0000 | ORAL_TABLET | Freq: Every day | ORAL | Status: DC

## 2011-09-25 NOTE — Assessment & Plan Note (Signed)
well controlled  

## 2011-09-25 NOTE — Progress Notes (Signed)
  Subjective:    Patient ID: Erin Small, female    DOB: September 08, 1955, 56 y.o.   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 Some exposure to dust.   DM - hasn't recently been checking cbg's due to machine being broken. Has some strips that her daughter was able to get inexpensively  Interview conducted in Spanish  Review of Systems     Objective:   Physical Exam  Constitutional: She appears well-nourished.       Only moderate abdominal obesity  Cardiovascular: Normal rate and regular rhythm.   Pulmonary/Chest: Effort normal and breath sounds normal.  Musculoskeletal: She exhibits no edema.          Assessment & Plan:

## 2011-09-25 NOTE — Patient Instructions (Signed)
Empiece con pioglitazone, el tercer medicamento para diabetes. Probablemente vamos a aumentar la dosis.   Llame al doctor Sheffield Slider con el nomber de maquina que esta usando medir la glucosa.   Please return to see Dr Sheffield Slider in 3 months. Regresar en 3 meses.

## 2011-09-25 NOTE — Assessment & Plan Note (Signed)
Still poorly controlled Will add Actos that I hope she can get inexpensively with her [employer] card. Start with 15 mg then gradually increase. She will call with the name of her machine in hopes I can find one that works for her.

## 2011-11-19 ENCOUNTER — Encounter: Payer: Self-pay | Admitting: Home Health Services

## 2011-11-21 ENCOUNTER — Encounter: Payer: Self-pay | Admitting: Home Health Services

## 2011-12-12 ENCOUNTER — Other Ambulatory Visit: Payer: Self-pay | Admitting: Family Medicine

## 2011-12-12 ENCOUNTER — Encounter: Payer: Self-pay | Admitting: Family Medicine

## 2011-12-12 DIAGNOSIS — H04123 Dry eye syndrome of bilateral lacrimal glands: Secondary | ICD-10-CM

## 2011-12-12 HISTORY — DX: Dry eye syndrome of bilateral lacrimal glands: H04.123

## 2011-12-12 MED ORDER — TEARS RENEWED OP SOLN: 1.0000 [drp] | Freq: Four times a day (QID) | OPHTHALMIC | Status: DC

## 2012-01-10 ENCOUNTER — Other Ambulatory Visit: Payer: Self-pay | Admitting: Family Medicine

## 2012-01-11 NOTE — Telephone Encounter (Signed)
Due for A1c and labs

## 2012-01-24 ENCOUNTER — Other Ambulatory Visit: Payer: Self-pay | Admitting: Family Medicine

## 2012-01-24 NOTE — Telephone Encounter (Signed)
No further refills until return visit and labs

## 2012-03-08 ENCOUNTER — Other Ambulatory Visit: Payer: Self-pay | Admitting: Family Medicine

## 2012-03-11 ENCOUNTER — Other Ambulatory Visit: Payer: Self-pay | Admitting: *Deleted

## 2012-03-11 MED ORDER — GLYBURIDE 5 MG PO TABS: 10.0000 mg | ORAL_TABLET | Freq: Two times a day (BID) | ORAL | Status: DC

## 2012-03-11 NOTE — Telephone Encounter (Signed)
Patient needs to be seen before further refills

## 2012-03-13 ENCOUNTER — Other Ambulatory Visit: Payer: Self-pay | Admitting: Family Medicine

## 2012-03-17 ENCOUNTER — Other Ambulatory Visit: Payer: Self-pay | Admitting: Family Medicine

## 2012-03-17 ENCOUNTER — Encounter: Payer: Self-pay | Admitting: *Deleted

## 2012-03-17 NOTE — Telephone Encounter (Signed)
This encounter was created in error - please disregard.

## 2012-03-17 NOTE — Telephone Encounter (Signed)
Overdue for office vist

## 2012-03-26 ENCOUNTER — Encounter: Payer: Self-pay | Admitting: *Deleted

## 2012-04-10 ENCOUNTER — Other Ambulatory Visit: Payer: Self-pay | Admitting: Family Medicine

## 2012-04-17 ENCOUNTER — Other Ambulatory Visit: Payer: Self-pay | Admitting: Family Medicine

## 2012-04-20 ENCOUNTER — Other Ambulatory Visit: Payer: Self-pay | Admitting: Family Medicine

## 2012-04-28 ENCOUNTER — Ambulatory Visit (INDEPENDENT_AMBULATORY_CARE_PROVIDER_SITE_OTHER): Payer: Self-pay | Admitting: Family Medicine

## 2012-04-28 ENCOUNTER — Encounter: Payer: Self-pay | Admitting: Family Medicine

## 2012-04-28 VITALS — BP 137/84 | HR 76 | Temp 98.3°F | Ht <= 58 in | Wt 121.0 lb

## 2012-04-28 DIAGNOSIS — E1165 Type 2 diabetes mellitus with hyperglycemia: Secondary | ICD-10-CM

## 2012-04-28 DIAGNOSIS — I10 Essential (primary) hypertension: Secondary | ICD-10-CM

## 2012-04-28 DIAGNOSIS — E785 Hyperlipidemia, unspecified: Secondary | ICD-10-CM

## 2012-04-28 LAB — COMPREHENSIVE METABOLIC PANEL
Albumin: 4.9 g/dL (ref 3.5–5.2)
Alkaline Phosphatase: 54 U/L (ref 39–117)
BUN: 18 mg/dL (ref 6–23)
CO2: 29 mEq/L (ref 19–32)
Calcium: 10.6 mg/dL — ABNORMAL HIGH (ref 8.4–10.5)
Chloride: 102 mEq/L (ref 96–112)
Glucose, Bld: 119 mg/dL — ABNORMAL HIGH (ref 70–99)
Potassium: 4.4 mEq/L (ref 3.5–5.3)
Sodium: 140 mEq/L (ref 135–145)
Total Protein: 7.8 g/dL (ref 6.0–8.3)

## 2012-04-28 NOTE — Assessment & Plan Note (Signed)
well controlled  

## 2012-04-28 NOTE — Progress Notes (Signed)
Interpreter Raniya Golembeski Namihira for [facility] 

## 2012-04-28 NOTE — Progress Notes (Signed)
  Subjective:    Patient ID: Erin Small, female    DOB: 1955-09-03, 57 y.o.   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 They are now back in [place] looking for more work. She's been feeling well without hypoglycemia, chest pain, or foot problems. She'd like to try losing weight before increasing medications. Her great toes feel asleep at times. Highest capilllary blood glucose's she's had have been in the 140's.   Hypertension - taking her medications without apparent side effects.    Review of Systems     Objective:   Physical Exam  Constitutional: She is oriented to person, place, and time. She appears well-developed and well-nourished.  Cardiovascular: Normal rate and regular rhythm.   No murmur heard. Pulmonary/Chest: Effort normal and breath sounds normal. She has no wheezes. She has no rales.  Musculoskeletal: She exhibits no edema.  Feet are normal  Neurological: She is alert and oriented to person, place, and time.          Assessment & Plan:

## 2012-04-28 NOTE — Assessment & Plan Note (Signed)
Improved diabetes control on the Pioglitazone despite a small weight gain.

## 2012-04-28 NOTE — Patient Instructions (Addendum)
Voy a llamarle con los [place] [phone]   I will call you with your results and refill your medications if they are all OK.   Please return to see Dr  in 6 months.

## 2012-04-30 ENCOUNTER — Other Ambulatory Visit: Payer: Self-pay | Admitting: Family Medicine

## 2012-04-30 MED ORDER — GLYBURIDE 5 MG PO TABS: 10.0000 mg | ORAL_TABLET | Freq: Two times a day (BID) | ORAL | Status: DC

## 2012-04-30 MED ORDER — LISINOPRIL-HYDROCHLOROTHIAZIDE 10-12.5 MG PO TABS: 2.0000 | ORAL_TABLET | Freq: Every day | ORAL | Status: DC

## 2012-04-30 MED ORDER — SIMVASTATIN 40 MG PO TABS: 40.0000 mg | ORAL_TABLET | Freq: Every day | ORAL | Status: DC

## 2012-04-30 NOTE — Addendum Note (Signed)
Addended by: Zachery Dauer on: 04/30/2012 03:43 PM   Modules accepted: Orders

## 2012-05-24 ENCOUNTER — Other Ambulatory Visit: Payer: Self-pay | Admitting: Family Medicine

## 2012-05-28 ENCOUNTER — Other Ambulatory Visit: Payer: Self-pay | Admitting: Family Medicine

## 2012-06-26 ENCOUNTER — Other Ambulatory Visit: Payer: Self-pay | Admitting: Family Medicine

## 2012-09-21 ENCOUNTER — Other Ambulatory Visit: Payer: Self-pay | Admitting: Family Medicine

## 2012-11-12 ENCOUNTER — Other Ambulatory Visit: Payer: Self-pay | Admitting: Family Medicine

## 2012-11-21 ENCOUNTER — Encounter: Payer: Self-pay | Admitting: Family Medicine

## 2012-11-21 ENCOUNTER — Ambulatory Visit (INDEPENDENT_AMBULATORY_CARE_PROVIDER_SITE_OTHER): Payer: Self-pay | Admitting: Family Medicine

## 2012-11-21 VITALS — BP 130/80 | HR 80 | Ht <= 58 in | Wt 122.8 lb

## 2012-11-21 DIAGNOSIS — IMO0001 Reserved for inherently not codable concepts without codable children: Secondary | ICD-10-CM

## 2012-11-21 DIAGNOSIS — E1165 Type 2 diabetes mellitus with hyperglycemia: Secondary | ICD-10-CM

## 2012-11-21 DIAGNOSIS — I10 Essential (primary) hypertension: Secondary | ICD-10-CM

## 2012-11-21 LAB — BASIC METABOLIC PANEL
BUN: 20 mg/dL (ref 6–23)
CO2: 27 mEq/L (ref 19–32)
Calcium: 10.1 mg/dL (ref 8.4–10.5)
Chloride: 97 mEq/L (ref 96–112)
Creat: 0.94 mg/dL (ref 0.50–1.10)
Glucose, Bld: 262 mg/dL — ABNORMAL HIGH (ref 70–99)
Sodium: 136 mEq/L (ref 135–145)

## 2012-11-21 LAB — CBC WITH DIFFERENTIAL/PLATELET
Eosinophils Absolute: 0.2 10*3/uL (ref 0.0–0.7)
Eosinophils Relative: 3 % (ref 0–5)
Hemoglobin: 12.8 g/dL (ref 12.0–15.0)
Lymphs Abs: 1.2 10*3/uL (ref 0.7–4.0)
MCH: 26.6 pg (ref 26.0–34.0)
MCV: 79.5 fL (ref 78.0–100.0)
Monocytes Absolute: 0.5 10*3/uL (ref 0.1–1.0)
Monocytes Relative: 7 % (ref 3–12)
RBC: 4.82 MIL/uL (ref 3.87–5.11)

## 2012-11-21 MED ORDER — GLYBURIDE 5 MG PO TABS: 10.0000 mg | ORAL_TABLET | Freq: Two times a day (BID) | ORAL | Status: DC

## 2012-11-21 MED ORDER — METFORMIN HCL 1000 MG PO TABS: ORAL_TABLET | ORAL | Status: DC

## 2012-11-21 MED ORDER — SITAGLIPTIN PHOSPHATE 100 MG PO TABS: 100.0000 mg | ORAL_TABLET | Freq: Every day | ORAL | Status: DC

## 2012-11-21 NOTE — Patient Instructions (Addendum)
Diabetes - stop Actos, Start januvia, increase your amount of exercise ( walk for at least 30 minutes 3-4 times per week), keep you eye appointment next month  Yearly physical/breast exam/GYN - schedule an appointment in 1-2 months for your year physical.  Diabetes- follow up in 3 months to recheck hemoglobin A1C

## 2012-11-22 ENCOUNTER — Encounter: Payer: Self-pay | Admitting: Family Medicine

## 2012-11-22 NOTE — Assessment & Plan Note (Signed)
Controlled on current regimen.   

## 2012-11-22 NOTE — Progress Notes (Signed)
  Subjective:    Patient ID: Erin Small, female    DOB: 01-Nov-1955, 57 y.o.   MRN: [id number]  HPI 57 y/o female presents for follow up of multiple medical conditions.  Non insulin dependent diabetes - currently on Actoz/Metformin/Glyburide, checks blood glucoses 3 times weekly, AM averages in the 120-130's, does have occasional hypoglycemic episodes (1-2 per month), takes medications as prescribed, denies side effects, has an appointment next month to see her ophthalmologist, does not follow with a podiatrist however denies foot issues, denies numbness/tingling in extremities, does not exercise regularly, diet consists of 3 vegetables per day, 3 fruits per day, states that she attempts to eat few carbs/fats.   Hypertension - currently on lisinopril-HCTZ, taking daily, no side effects, no vision changes, no chest pain, no headaches, no lightheadedness/dizziness  Social -Non smoker, works as a Engineer, site, has been out of work for the past few weeks   Review of Systems  Constitutional: Negative for fever, chills and fatigue.  Respiratory: Negative for cough and shortness of breath.   Cardiovascular: Negative for chest pain.  Gastrointestinal: Negative for nausea, vomiting, diarrhea and constipation.  Neurological: Negative for dizziness and numbness.       Objective:   Physical Exam Vitals: reviewed Gen: pleasant Hispanic female, NAD HEENT: PERRL, EOMI, MMM, neck supple, no adenopathy Cardiac:RRR, S1 and S2 present, no murmurs, no heaves/thrills Resp: CTAB, normal effort Ext: no edema, 2+ radial and DP pulses Skin: no foot ulcers, normal foot exam       Assessment & Plan:  Please see problem specific assessment and plan. Due for yearly physical exam.

## 2012-11-22 NOTE — Assessment & Plan Note (Signed)
Uncontrolled (A1C 8.2). Due for eye exam next month.  -Will stop Actoz and replace with Januvia 100 mg daily -Continue Metformin and Glyburide at current doses -Encouraged weight loss/balanced diet/regular exercise -If continues to trend upwards may need to consider insulin therapy

## 2012-12-15 ENCOUNTER — Other Ambulatory Visit: Payer: Self-pay | Admitting: Family Medicine

## 2012-12-19 ENCOUNTER — Encounter: Payer: Self-pay | Admitting: Family Medicine

## 2012-12-19 DIAGNOSIS — E11319 Type 2 diabetes mellitus with unspecified diabetic retinopathy without macular edema: Secondary | ICD-10-CM | POA: Insufficient documentation

## 2012-12-19 DIAGNOSIS — H11001 Unspecified pterygium of right eye: Secondary | ICD-10-CM | POA: Insufficient documentation

## 2012-12-19 HISTORY — DX: Unspecified pterygium of right eye: H11.001

## 2013-01-02 ENCOUNTER — Other Ambulatory Visit: Payer: Self-pay | Admitting: Family Medicine

## 2013-04-14 ENCOUNTER — Ambulatory Visit (INDEPENDENT_AMBULATORY_CARE_PROVIDER_SITE_OTHER): Payer: Self-pay | Admitting: Family Medicine

## 2013-04-14 ENCOUNTER — Encounter: Payer: Self-pay | Admitting: Family Medicine

## 2013-04-14 VITALS — BP 136/80 | HR 80 | Ht <= 58 in | Wt 116.6 lb

## 2013-04-14 DIAGNOSIS — M545 Low back pain, unspecified: Secondary | ICD-10-CM

## 2013-04-14 DIAGNOSIS — IMO0002 Reserved for concepts with insufficient information to code with codable children: Secondary | ICD-10-CM

## 2013-04-14 DIAGNOSIS — E1165 Type 2 diabetes mellitus with hyperglycemia: Secondary | ICD-10-CM

## 2013-04-14 DIAGNOSIS — I10 Essential (primary) hypertension: Secondary | ICD-10-CM

## 2013-04-14 DIAGNOSIS — IMO0001 Reserved for inherently not codable concepts without codable children: Secondary | ICD-10-CM

## 2013-04-14 LAB — POCT GLYCOSYLATED HEMOGLOBIN (HGB A1C): Hemoglobin A1C: 9.9

## 2013-04-14 MED ORDER — SITAGLIPTIN PHOSPHATE 100 MG PO TABS
100.0000 mg | ORAL_TABLET | Freq: Every day | ORAL | Status: DC
Start: 2013-04-14 — End: 2014-01-14

## 2013-04-14 NOTE — Patient Instructions (Addendum)
Back Pain - please apply heat to the affected area 2-3 times daily for 10-15 minutes at a time, may take Tylenol up to 1000 mg every 8 hours (do not take greater than 3000 mg in a day).  High Blood Pressure - continue current dose of Lisinopril-HCTZ, check Basic Metabolic Panel today  Diabetes - uncontrolled, continue current doses of Metformin and Glyburide, apply for orange card, start Januvia 100 mg once daily  Return in one month for annual exam. Please make an appointment with Dr. Randolm IdolFletke.

## 2013-04-15 ENCOUNTER — Encounter: Payer: Self-pay | Admitting: Family Medicine

## 2013-04-15 DIAGNOSIS — M545 Low back pain, unspecified: Secondary | ICD-10-CM

## 2013-04-15 HISTORY — DX: Low back pain, unspecified: M54.50

## 2013-04-15 LAB — BASIC METABOLIC PANEL
BUN: 18 mg/dL (ref 6–23)
CALCIUM: 9.7 mg/dL (ref 8.4–10.5)
CHLORIDE: 99 meq/L (ref 96–112)
CO2: 27 mEq/L (ref 19–32)
Creat: 0.81 mg/dL (ref 0.50–1.10)
Glucose, Bld: 190 mg/dL — ABNORMAL HIGH (ref 70–99)
Potassium: 4.3 mEq/L (ref 3.5–5.3)
SODIUM: 138 meq/L (ref 135–145)

## 2013-04-15 NOTE — Progress Notes (Signed)
   Subjective:    Patient ID: Erin Small, female    DOB: 1955/05/03, 58 y.o.   MRN: [id number]  HPI 58 y/o Hispanic female presents for follow up of multiple medical issues.  Diabetes - currently on Metformin and Glyburide, tolerating well, she was started on Januvia at last visit however did not start due to cost, up to date on foot and eye exams, denies foot or hand pain/numbness, no hypoglycemic episodes, AM glucoses ranging 100-150's  Hypertension - currently on Lisinopril - HCTZ, tolerating well, no missed doses, no chest pain, no vision changes, no headache  Exercise - patient reports increased exercise from last visit, walks on the treadmill for 15-20 minutes 3-4 times per week, she has had an approx. 5 pounds weight loss from last visit  Low back pain - one week history of low back pain, no inciting event, no saddle anesthesia, no bladder or bowel incontinence, no LE weakness/numbness  Social - non smoker   Review of Systems  Constitutional: Negative for fever, chills and fatigue.  Respiratory: Negative for shortness of breath.   Gastrointestinal: Negative for nausea, diarrhea and constipation.  Skin: Negative for wound.       Objective:   Physical Exam Vitals: reviewed Gen: pleasant Hispanic female, NAD HEENT: normocephalic, PERRL, EOMI, no scleral icterus, nasal septum midline, MMM, no pharyngeal erythema or exudate, neck supple, no anterior or posterior cervical lymphadenopathy Cardiac: RRR, S1 and S2 present, no murmurs, no heaves/thrills Resp: CTAB, normal effort Ext: no edema, normal diabetic foot exam MSK - bilateral lumbar paraspinal tenderness, straight leg testing negative bilaterally Neuro: CN 2-12 intact, strength 5/5 in bilateral LE, 2+ patellar and achilles reflexes bilaterally, sensation to light touch grossly intact in all extremities  A1C 9.9    Assessment & Plan:  Please see problem specific assessment and plan.

## 2013-04-15 NOTE — Assessment & Plan Note (Signed)
Low back pan consistent with muscle spasm/strain. No reg flag signs/symptoms. -conservative management discussed including heat/ice and tylenol as needed for pain.

## 2013-04-15 NOTE — Assessment & Plan Note (Signed)
Uncontrolled. A1C now 9.9. Patient was unable to afford Januvia after last visit. Medication assistance was discussed with the patient. As she does not have a SS # she is unable to apply directly to [employer](makers of NimmonsJanuvia) for medication assistance. I also discussed the MAP program with the patient. Unfortunately Januvia is not offered through this service. Our only option through MAP would be Novolin 70/30. The patient adamantly refused initiation of Insulin. The patient would like to purchase Januvia at full price. A refill was sent to the pharmacy for her. Patient was counseled to continue to lose weight and exercise regularly. Scheduled for Annual Well Visit in one month, at that time will revisit diabetic mediations.

## 2013-04-15 NOTE — Assessment & Plan Note (Signed)
Controlled on current regimen.   

## 2013-04-18 ENCOUNTER — Other Ambulatory Visit: Payer: Self-pay | Admitting: Family Medicine

## 2013-05-18 ENCOUNTER — Ambulatory Visit (INDEPENDENT_AMBULATORY_CARE_PROVIDER_SITE_OTHER): Payer: Self-pay | Admitting: Family Medicine

## 2013-05-18 ENCOUNTER — Other Ambulatory Visit (HOSPITAL_COMMUNITY)
Admission: RE | Admit: 2013-05-18 | Discharge: 2013-05-18 | Disposition: A | Discharge status: Home / Self Care | Payer: Self-pay | Source: Ambulatory Visit | Attending: Family Medicine | Admitting: Family Medicine

## 2013-05-18 ENCOUNTER — Encounter: Payer: Self-pay | Admitting: Family Medicine

## 2013-05-18 VITALS — BP 136/81 | HR 93 | Temp 98.4°F | Ht <= 58 in | Wt 113.0 lb

## 2013-05-18 DIAGNOSIS — Z1151 Encounter for screening for human papillomavirus (HPV): Secondary | ICD-10-CM | POA: Insufficient documentation

## 2013-05-18 DIAGNOSIS — E785 Hyperlipidemia, unspecified: Secondary | ICD-10-CM

## 2013-05-18 DIAGNOSIS — IMO0002 Reserved for concepts with insufficient information to code with codable children: Secondary | ICD-10-CM

## 2013-05-18 DIAGNOSIS — Z0000 Encounter for general adult medical examination without abnormal findings: Secondary | ICD-10-CM

## 2013-05-18 DIAGNOSIS — IMO0001 Reserved for inherently not codable concepts without codable children: Secondary | ICD-10-CM

## 2013-05-18 DIAGNOSIS — Z01419 Encounter for gynecological examination (general) (routine) without abnormal findings: Secondary | ICD-10-CM | POA: Insufficient documentation

## 2013-05-18 DIAGNOSIS — Z124 Encounter for screening for malignant neoplasm of cervix: Secondary | ICD-10-CM

## 2013-05-18 DIAGNOSIS — E1165 Type 2 diabetes mellitus with hyperglycemia: Secondary | ICD-10-CM

## 2013-05-18 LAB — LIPID PANEL
CHOL/HDL RATIO: 4.6 ratio
Cholesterol: 202 mg/dL — ABNORMAL HIGH (ref 0–200)
HDL: 44 mg/dL (ref >39)
LDL Cholesterol: 115 mg/dL — ABNORMAL HIGH (ref 0–99)
Triglycerides: 215 mg/dL — ABNORMAL HIGH (ref <150)
VLDL: 43 mg/dL — ABNORMAL HIGH (ref 0–40)

## 2013-05-18 MED ORDER — GLYBURIDE 5 MG PO TABS: ORAL_TABLET | ORAL | Status: DC

## 2013-05-18 NOTE — Patient Instructions (Signed)
It was nice to see you today.  I am happy with you blood sugars, please continue your current medication regimen.  You are up to date on your immunizations and colon cancer screening. Please schedule your mammogram. Dr. Randolm IdolFletke will call you with the results of your pap smear and lab work.   Please return in 2 month for follow up of your diabetes.

## 2013-05-19 NOTE — Assessment & Plan Note (Signed)
Patient presents for annual physical and GYN exam. She is up-to-date on immunizations. She is up-to-date on her colonoscopy. She is due for Pap smear which is performed today. Patient given information to schedule mammogram. Information provided on living will and POA. Routine lab work obtained. -Return to office in one year for annual well visit

## 2013-05-19 NOTE — Progress Notes (Signed)
Patient ID: Erin Small, female   DOB: 1955/03/18, 58 y.o.   MRN: [id number] 58 y.o. year old female presents for well woman/preventative visit and annual GYN examination.  Acute Concerns: No acute concerns however patient would like to discuss her diabetes, she was started on Januvia one month ago in addition to Metformin and Glyburide, she is tolerating the medication well, fasting glucoses have been in the 70-100's, she has had a few hypoglycemic episodes however this occurred last week when she had viral/flu like symptoms and a decreased appetite.   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  Sexual/Birth History: Postmenopausal, sexually active with her husband  POA/Living Will: She has not designated a POA or completed a living will.   Social:  History   Social History  . Marital Status: Single    Spouse Name: Oswaldo DoneHector Poot    Number of Children: N/A  . Years of Education: 6   Social History Main Topics  . Smoking status: Never Smoker   . Smokeless tobacco: None  . Alcohol Use: Yes     Comment: occasionallgy  . Drug Use: None  . Sexual Activity: Yes    Partners: Male   Other Topics Concern  . None   Social History Narrative   Native of [place], in [place] 2001   Partner, Oswaldo DoneHector Poot   Works cleaning homes.     Immunization:  Tdap/TD:2006  Influenza:2014  Pneumococcal:Has not received (too young)  Herpes Zoster:Has not received (too young)  Cancer Screening:  Pap Smear:2011 (no previous abnormal PAP smears)  Mammogram: 2011 (no current breast symptoms)  Colonoscopy:2012 (normal exam)  Dexa:Never completed  Physical Exam: VITALS: Reviewed GEN: Pleasant Hispanic female, no acute distress HEENT: Normocephalic, pupils are equal round and reactive to light,  extraocular movements are intact, no scleral icterus, nasal septum midline, no rhinorrhea, moist extremities, uvula midline, no pharyngeal erythema or exudate noted, neck was supple, no anterior posterior cervical lymphadenopathy, no thyromegaly CARDIAC: Regular in rhythm, S1 and S2 present, no murmurs, no heaves or thrills RESP: Clear to patient bilaterally, normal effort BREAST:Exam performed in the presence of a chaperone. Normal breast exam bilaterally without masses, no nipple discharge, no axillary lymphadenopathy ABD: Soft, nontender, bowel sounds present, no rebound, no guarding GU/GYN:Exam performed in the presence of a chaperone. Normal external female genitalia, speculum examination identified normal appearing cervix without discharge, Pap smear obtained, bimanual exam performed and unremarkable EXT: No edema SKIN: Warm, dry, intact, no rashes, no lesions suspicious for malignancy  ASSESSMENT & PLAN: 58 y.o. female presents for annual well woman/preventative exam and GYN exam. Please see problem specific assessment and plan.

## 2013-05-19 NOTE — Assessment & Plan Note (Signed)
Fasting blood sugars appear under controlled on Januvia. Patient encouraged to continue to healthy diet and exercise regularly. -Return in 2 months for repeat hemoglobin A1c

## 2013-05-20 ENCOUNTER — Encounter: Payer: Self-pay | Admitting: Family Medicine

## 2013-05-25 ENCOUNTER — Other Ambulatory Visit: Payer: Self-pay | Admitting: Family Medicine

## 2013-06-22 ENCOUNTER — Encounter: Payer: Self-pay | Admitting: Sports Medicine

## 2013-06-22 ENCOUNTER — Ambulatory Visit (INDEPENDENT_AMBULATORY_CARE_PROVIDER_SITE_OTHER): Payer: Self-pay | Admitting: Sports Medicine

## 2013-06-22 VITALS — BP 145/58 | HR 84 | Temp 98.1°F | Ht <= 58 in | Wt 114.2 lb

## 2013-06-22 DIAGNOSIS — T7840XA Allergy, unspecified, initial encounter: Secondary | ICD-10-CM

## 2013-06-22 MED ORDER — CETIRIZINE HCL 10 MG PO TABS: 10.0000 mg | ORAL_TABLET | Freq: Every day | ORAL | Status: DC

## 2013-06-22 MED ORDER — FAMOTIDINE 20 MG PO TABS: 20.0000 mg | ORAL_TABLET | Freq: Two times a day (BID) | ORAL | Status: DC

## 2013-06-22 MED ORDER — HYDROCORTISONE 2.5 % EX OINT: TOPICAL_OINTMENT | Freq: Two times a day (BID) | CUTANEOUS | Status: DC

## 2013-06-22 NOTE — Patient Instructions (Signed)
Alergia a los frmacos (Drug Allergy) Las Chief of Staff a los frmacos son frecuentes. Algunas reacciones alrgicas son leves. Un tipo de [place] retardada a las drogas que ocurre luego de una semana o ms despus de la exposicin al medicamento o la vacuna se conoce como enfermedad del suero. Una reaccin alrgica sbita (aguda) que involucra a todo el organismo se denomina anafilaxis. Beatrice "verdaderas" alergias a las drogas aparecen cuando se tiene una reaccin alrgica a un medicamento. Est ocasionada por la sobreactividad del sistema inmunolgico. El cuerpo se sensibiliza. El sistema inmunolgico reacciona con la primera exposicin al [employer]. Luego de [place], su utilizacin a futuro Counselling psychologist vida. Casi cualquier medicamento puede causar Chief of Staff. Los ms comunes son:  Penicilina.  Sulfonamida.  Anestsicos locales.  Tinturas para rayos X que contienen yodo. SNTOMAS Los sntomas ms frecuentes de las alergias menores son:   Hinchazn alrededor de la boca.  Una erupcin roja que produce picazn o urticaria.  Vmitos o diarrea. La anafilaxis puede producir hinchazn de la boca y Patent examiner. Esto dificulta la respiracin y la deglucin. Las [employer] graves pueden ser fatales en segundos incluso luego de haber estado expuesto a una pequea cantidad de la droga que la haya producido.  INSTRUCCIONES PARA EL CUIDADO DOMICILIARIO  Si no est seguro de que es lo que le produce la reaccin, Quarry manager un registro de los alimentos que come y los medicamentos que ha ingerido. Incluya los sntomas que le siguen. Evite los Nurse, mental health.  Deber realizar un seguimiento con un especialista en alergia despus que la reaccin haya desaparecido, para que pueda ser diagnosticado y Presenter, broadcasting. Es importante confirmar que su reaccin es IT consultant y no slo un efecto secundario al [employer]. Si tiene una alergia verdadera a Secretary/administrator, tendr Mirant administren ese medicamento y otros similares cuando se enferme.  Si presenta urticaria o una erupcin cutnea:  Tome los medicamentos como se le indic.  Puede utilizar un antihistamnico de venta libre (difenhidramina), segn sea necesario.  Aplique compresas fras en la piel. Tome un bao de agua fresca. Evite los [address] calientes.  Si usted es muy alrgico:  Awilda Bill reaccin grave y su tratamiento, ser necesaria la observacin continua. A menudo es necesaria la hospitalizacin.  Utilice un brazalete o collar de alerta mdico, indicando que usted es Air cabin crew.  Usted y su familia deben aprender como usar el kit para la anafilaxis o a Architectural technologist una inyeccin de epinefrina para tratar termporariamente una reaccin alrgica de emergencia. Si usted ya ha sufrido una reaccin grave, siempre lleve el kit anafilctico o la inyeccin de epinefrina con usted. Esto podr salvarle la vida si tiene una reaccin grave.  No conduzca ni realice tareas despus del tratamiento hasta que haya terminado los medicamentos o hasta que tenga la autorizacin del profesional que lo asiste. SOLICITE ATENCIN MDICA SI:  Sospecha que puede sufrir Buyer, retail. Los sntomas generalmente ocurren dentro de los 30 minutos posteriores a la exposicin.  Los sntomas empeoran en vez de Teacher, English as a foreign language.  Desarrolla nuevos sntomas.  Vuelven los sntomas por los que ha consultado. SOLICITE ATENCIN MDICA DE INMEDIATO SI:  Tiene hinchazn en la boca, jadeos o dificultad respiratoria.  Tiene una sensacin de opresin en el pecho o en la garganta.  Presenta sarpullido, hinchazn o picazn en todo el cuerpo.  Presenta diarrea o vmitos.  Se marea o pierde el conocimiento. Esto es Engineer, maintenance (IT). Use la inyeccin de  epinefrina o el kit para anafilaxis del modo en que le han indicado. Pida ayuda mdica de emergencia. Aunque haya mejorado luego de la inyeccin, deber  examinarse en el departamento de emergencias del hospital. EST SEGURO QUE:   Comprende las instrucciones para el alta mdica.  Controlar su enfermedad.  Solicitar atencin mdica de inmediato segn las indicaciones. Document Released: 10/22/2006 Document Revised: 03/19/2011 [facility] Patient Information 2014 [employer], [place].

## 2013-06-22 NOTE — Progress Notes (Signed)
  Rockne MenghiniMaria Marciano - 58 y.o. female MRN [id number]  Date of birth: 09/05/1955  SUBJECTIVE:  Including CC & ROS.  Chief Complaint  Patient presents with  . Rash   began last night.  She had been shopping for handbags and jewelry.  Began immediately following this.  Note difficulty breathing or swallowing.  Rash is diffuse among arms and trunk.  Worse on right upper extremity were was trying on the jewelry.  Has had a prior drug allergies similar to this but this is significantly worse.   HISTORY: Past Medical, Surgical, Social, and Family History Reviewed & Updated per EMR. Pertinent Historical Findings include: Type 2 diabetes with diabetic retinopathy, hyperlipidemia, hypertension, low back pain,  PHYSICAL EXAM:  VS: BP:145/58 mmHg  HR:84bpm  TEMP:98.1 F (36.7 C)(Oral)  RESP:   HT:4\' 8"  (142.2 cm)   WT:114 lb 3.2 oz (51.801 kg)  BMI:25.7 PHYSICAL EXAM: GENERAL:  Adult Hispanic female. In no discomfort; no respiratory distress PSYCH:  alert and appropriate, good insight  HNEENT:  mmm, no JVD CARDIAC:  RRR, S1/S2 heard, no murmur LUNGS:  CTA B, no wheezes, no crackles EXTREM:  Warm, well perfused.  Moves all 4 extremities spontaneously; no lateralization.   Skin:  Right upper extremity with macular papular lesions consistent with a drug allergy on the flexor surfaces of bilateral forearms, right worse than left.  Similar pruritic papules on trunk and right axilla.  No oropharyngeal lesions  ASSESSMENT & PLAN: See problem based charting & AVS for pt instructions.

## 2013-06-24 DIAGNOSIS — T7840XA Allergy, unspecified, initial encounter: Secondary | ICD-10-CM | POA: Insufficient documentation

## 2013-06-24 HISTORY — DX: Allergy, unspecified, initial encounter: T78.40XA

## 2013-06-24 NOTE — Assessment & Plan Note (Signed)
Acute condition  - unclear causative agent, likely costuseems to be a systemic component.  She's had no changes in her medicines and cannot think of any new or different foods.  There is no oral involvement or concerns for SJS, etc.  symptomatic treatment including H2 blocker   1. Zyrtec, Pepcid & Hydroctisone Treatment 2. I have discussed the expected course and duration of this process and have reviewed signs and symptoms that warrant emergent evaluation. > Consider allergy testing if recurrent or persistent.

## 2013-07-01 ENCOUNTER — Telehealth: Payer: Self-pay | Admitting: Family Medicine

## 2013-07-01 MED ORDER — LISINOPRIL-HYDROCHLOROTHIAZIDE 10-12.5 MG PO TABS: 2.0000 | ORAL_TABLET | Freq: Every day | ORAL | Status: DC

## 2013-07-01 NOTE — Telephone Encounter (Signed)
Note to nursing staff - please call patient to let her know that medication has been sent to pharmacy.

## 2013-07-01 NOTE — Telephone Encounter (Signed)
Needs refill on lisinopril  [facility] [address]

## 2013-07-01 NOTE — Telephone Encounter (Signed)
Patient informed. 

## 2013-07-04 ENCOUNTER — Other Ambulatory Visit: Payer: Self-pay | Admitting: Family Medicine

## 2013-08-11 ENCOUNTER — Other Ambulatory Visit: Payer: Self-pay | Admitting: Family Medicine

## 2013-11-16 LAB — HM DIABETES EYE EXAM

## 2013-12-17 ENCOUNTER — Encounter: Payer: Self-pay | Admitting: Neurology

## 2013-12-19 ENCOUNTER — Other Ambulatory Visit: Payer: Self-pay | Admitting: Family Medicine

## 2014-01-13 ENCOUNTER — Telehealth: Payer: Self-pay | Admitting: Family Medicine

## 2014-01-13 DIAGNOSIS — E1165 Type 2 diabetes mellitus with hyperglycemia: Secondary | ICD-10-CM

## 2014-01-13 DIAGNOSIS — IMO0002 Reserved for concepts with insufficient information to code with codable children: Secondary | ICD-10-CM

## 2014-01-13 NOTE — Telephone Encounter (Signed)
Pt ran out of her diabetes medication, scheduled an appt for 1/19, wants to know if MD will refill until her appt? Pt is calling pharmacy now so they can send refill request.

## 2014-01-14 MED ORDER — METFORMIN HCL 1000 MG PO TABS: ORAL_TABLET | ORAL | Status: DC

## 2014-01-14 MED ORDER — GLYBURIDE 5 MG PO TABS: ORAL_TABLET | ORAL | Status: DC

## 2014-01-14 MED ORDER — SITAGLIPTIN PHOSPHATE 100 MG PO TABS: 100.0000 mg | ORAL_TABLET | Freq: Every day | ORAL | Status: DC

## 2014-01-14 NOTE — Telephone Encounter (Signed)
Refilled 6 month supply of Metformin, Januvia, and Glyburide

## 2014-01-26 ENCOUNTER — Ambulatory Visit (INDEPENDENT_AMBULATORY_CARE_PROVIDER_SITE_OTHER): Payer: Self-pay | Admitting: Family Medicine

## 2014-01-26 ENCOUNTER — Encounter: Payer: Self-pay | Admitting: Family Medicine

## 2014-01-26 VITALS — BP 144/88 | HR 79 | Temp 98.0°F | Ht <= 58 in | Wt 113.0 lb

## 2014-01-26 DIAGNOSIS — I10 Essential (primary) hypertension: Secondary | ICD-10-CM

## 2014-01-26 DIAGNOSIS — E1165 Type 2 diabetes mellitus with hyperglycemia: Secondary | ICD-10-CM

## 2014-01-26 DIAGNOSIS — IMO0002 Reserved for concepts with insufficient information to code with codable children: Secondary | ICD-10-CM

## 2014-01-26 LAB — POCT GLYCOSYLATED HEMOGLOBIN (HGB A1C): HEMOGLOBIN A1C: 8.4

## 2014-01-26 MED ORDER — SIMVASTATIN 40 MG PO TABS: 40.0000 mg | ORAL_TABLET | Freq: Every day | ORAL | Status: DC

## 2014-01-26 MED ORDER — LISINOPRIL-HYDROCHLOROTHIAZIDE 10-12.5 MG PO TABS: 2.0000 | ORAL_TABLET | Freq: Every day | ORAL | Status: DC

## 2014-01-26 NOTE — Patient Instructions (Signed)
It was nice to see you today.   High Blood Pressure - no changes in medications, [facility]  Diabetes - improved, continue Glyburide and Metformin, januvia refill sent to pharmacy  Return in 3 months to check diabetes.

## 2014-01-27 NOTE — Progress Notes (Signed)
   Subjective:    Patient ID: Erin MenghiniMaria Whelchel, female    DOB: 10-Nov-1955, 59 y.o.   MRN: [id number]  HPI 59 y/o female presents for routine follow up.   T2DM - patient taking Glyburide 10 mg BID and Metformin 1500 mg in AM and 1000 mg in PM, has not been taking Januvia (partly due to cost, but also was in [place] for past 6 months), no foot/hand numbness, does not check blood sugars regularly, up to date on diabetic foot and eye exams. She reports weight loss from previous visit  Hypertension - currently on Prinzide 10-12.5 mg daily, reports compliance with medication, no side effects, no chest pain/headache  Also reports few week history of runny nose/sore throat/congestion which is slowly resolving, has been taking otc cold medications  Social - recently returned from 6 months in [place], was there to deal with "family issues", would not go into more detail   Review of Systems  Constitutional: Negative for fever, chills and fatigue.  Respiratory: Positive for cough. Negative for shortness of breath.   Cardiovascular: Negative for chest pain.  Gastrointestinal: Negative for nausea, vomiting and diarrhea.       Objective:   Physical Exam Vitals: reviewed Gen: pleasant female, NAD HEENT: normocephalic, PERRL, EOMI, no scleral icterus, MMM, uvula midline, neck supple, no adenopathy, mild rhinorrhea present Cardiac: RRR, S1 and S2 present, no murmur, no heaves/thrills Resp: CTAB, normal effort Foot Exam: normal foot exam, no ulceration, sensation intact, 2+ DP and PT pulses  poc A1C 8.4 (down from 9.9)     Assessment & Plan:  Please see problem specific assessment and plan.

## 2014-01-27 NOTE — Assessment & Plan Note (Signed)
Remains uncontrolled however A1C down to 8.4 (from 9.9). -continue metformin and Glyburide -restart Januvia -Diet and Exercise discussed -recheck A1C in 3 months

## 2014-01-27 NOTE — Assessment & Plan Note (Signed)
BP slightly elevated. May be related to recent cold medication use. -patient to check BP at home/pharmacy and call results into the office

## 2014-01-30 ENCOUNTER — Other Ambulatory Visit: Payer: Self-pay | Admitting: Family Medicine

## 2014-07-20 ENCOUNTER — Other Ambulatory Visit: Payer: Self-pay | Admitting: Family Medicine

## 2014-08-02 ENCOUNTER — Encounter: Payer: Self-pay | Admitting: Family Medicine

## 2014-08-02 ENCOUNTER — Ambulatory Visit (INDEPENDENT_AMBULATORY_CARE_PROVIDER_SITE_OTHER): Payer: Self-pay | Admitting: Family Medicine

## 2014-08-02 VITALS — BP 141/70 | HR 71 | Temp 97.9°F | Ht <= 58 in | Wt 112.7 lb

## 2014-08-02 DIAGNOSIS — E1142 Type 2 diabetes mellitus with diabetic polyneuropathy: Secondary | ICD-10-CM | POA: Insufficient documentation

## 2014-08-02 DIAGNOSIS — E119 Type 2 diabetes mellitus without complications: Secondary | ICD-10-CM

## 2014-08-02 DIAGNOSIS — IMO0002 Reserved for concepts with insufficient information to code with codable children: Secondary | ICD-10-CM

## 2014-08-02 DIAGNOSIS — E1165 Type 2 diabetes mellitus with hyperglycemia: Secondary | ICD-10-CM

## 2014-08-02 DIAGNOSIS — I10 Essential (primary) hypertension: Secondary | ICD-10-CM

## 2014-08-02 DIAGNOSIS — E1342 Other specified diabetes mellitus with diabetic polyneuropathy: Secondary | ICD-10-CM

## 2014-08-02 DIAGNOSIS — E785 Hyperlipidemia, unspecified: Secondary | ICD-10-CM

## 2014-08-02 DIAGNOSIS — G629 Polyneuropathy, unspecified: Secondary | ICD-10-CM

## 2014-08-02 HISTORY — DX: Type 2 diabetes mellitus with diabetic polyneuropathy: E11.42

## 2014-08-02 LAB — BASIC METABOLIC PANEL
BUN: 16 mg/dL (ref 7–25)
CO2: 26 mmol/L (ref 20–31)
CREATININE: 0.74 mg/dL (ref 0.50–1.05)
Calcium: 9.7 mg/dL (ref 8.6–10.4)
Chloride: 100 mmol/L (ref 98–110)
GLUCOSE: 109 mg/dL — AB (ref 65–99)
Potassium: 4.6 mmol/L (ref 3.5–5.3)
Sodium: 138 mmol/L (ref 135–146)

## 2014-08-02 LAB — LIPID PANEL
CHOL/HDL RATIO: 7 ratio — AB (ref <=5.0)
Cholesterol: 246 mg/dL — ABNORMAL HIGH (ref 125–200)
HDL: 35 mg/dL — ABNORMAL LOW (ref >=46)
LDL Cholesterol: 144 mg/dL — ABNORMAL HIGH (ref <130)
TRIGLYCERIDES: 333 mg/dL — AB (ref <150)
VLDL: 67 mg/dL — ABNORMAL HIGH (ref <30)

## 2014-08-02 LAB — CBC
HCT: 40.7 % (ref 36.0–46.0)
Hemoglobin: 13.2 g/dL (ref 12.0–15.0)
MCH: 26.2 pg (ref 26.0–34.0)
MCHC: 32.4 g/dL (ref 30.0–36.0)
MCV: 80.8 fL (ref 78.0–100.0)
MPV: 10.4 fL (ref 8.6–12.4)
Platelets: 487 10*3/uL — ABNORMAL HIGH (ref 150–400)
RBC: 5.04 MIL/uL (ref 3.87–5.11)
RDW: 14.8 % (ref 11.5–15.5)
WBC: 6.2 10*3/uL (ref 4.0–10.5)

## 2014-08-02 LAB — POCT GLYCOSYLATED HEMOGLOBIN (HGB A1C): HEMOGLOBIN A1C: 9.6

## 2014-08-02 MED ORDER — GLYBURIDE 5 MG PO TABS: ORAL_TABLET | ORAL | Status: DC

## 2014-08-02 MED ORDER — LISINOPRIL-HYDROCHLOROTHIAZIDE 10-12.5 MG PO TABS: 2.0000 | ORAL_TABLET | Freq: Every day | ORAL | Status: DC

## 2014-08-02 NOTE — Patient Instructions (Signed)
It was nice to see you today.  Your blood sugars are high today. Please attempt to get the orange card and get into the Medication Assistance Program. Please return to the office when you have the orange card and we can adjust your medications.   Dr. Randolm Idol will call you with your lab results.

## 2014-08-02 NOTE — Assessment & Plan Note (Signed)
Patient has hyperlipidemia on simvastatin. -Check lipid profile in calculate ASCVD -Titrate statin as indicated

## 2014-08-02 NOTE — Assessment & Plan Note (Signed)
Blood pressure at goal. -Continue current therapy with Prinzide 10-12 0.5 mg by mouth twice a day

## 2014-08-02 NOTE — Assessment & Plan Note (Signed)
Uncontrolled type 2 diabetes. Up-to-date on foot and eye exams. The limiting factor in the patient's care appears to be the fact that she has no medical insurance and cannot afford medications. -I have highly encouraged her to apply for the orange card and to get set up with the medication assistance program -Patient is to return after she has been set up with the orange card and we will titrate medications at that time -Lifestyle modifications including diet and exercise were discussed as well

## 2014-08-02 NOTE — Assessment & Plan Note (Signed)
Patient has symptoms consistent with diabetic peripheral neuropathy. No sensation of numbness. Monofilament testing intact. -Will not start pharmacotherapy at this time however if progresses consider gabapentin

## 2014-08-02 NOTE — Progress Notes (Signed)
   Subjective:    Patient ID: Erin Small, female    DOB: [birth year], 59 y.o.   MRN: [id number]  HPI 59 year old female presents for follow-up of diabetes.  Non-insulin-dependent type 2 diabetes-patient currently taking metformin and glyburide as prescribed, however she has not been taking Januvia as prescribed at her last visit as the medication is too expensive, she currently does not have medical insurance, the patient also reports poor diet over the past 3-4 months, she has been working out of town and therefore is been eating a large amount of fast food, she does report exercising 2-3 times per week each consists of walking for approximately 20 minutes, she does report very minimal tingling of bilateral upper and lower extremities without numbness  Hypertension-patient currently on Prinzide 10-12 0.52 tablets daily, no chest pain, no shortness of breath, headaches  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  Respiratory: Negative for cough and shortness of breath.   Cardiovascular: Negative for chest pain and leg swelling.  See history of present illness     Objective:   Physical Exam Vitals: Reviewed Gen.: Pleasant Hispanic female, no acute distress HEENT: Normocephalic, pupils are equal round and reactive to light, extraocular movements are intact, no scleral icterus, moist mucous members, neck was supple, no anterior posterior cervical lymphadenopathy Cardiac: Regular rate and rhythm, S1 and S2 present, no murmurs, no heaves or thrills Respiratory: Clear to station bilaterally, normal effort Extremity: No edema Foot exam: See: Metrix  POC A1c is 9.6 (elevated from 8.4 in January)     Assessment & Plan:  Please see problem specific assessment and plan.

## 2014-08-04 ENCOUNTER — Telehealth: Payer: Self-pay | Admitting: Family Medicine

## 2014-08-04 MED ORDER — ATORVASTATIN CALCIUM 40 MG PO TABS: 40.0000 mg | ORAL_TABLET | Freq: Every day | ORAL | Status: DC

## 2014-08-04 NOTE — Telephone Encounter (Signed)
Called patient using phone interpretor to discuss lab results. Discussed high cholesterol. Will change to Lipitor (advised that may be expensive). May need to look into this medication after she gets the orange card.

## 2015-02-04 ENCOUNTER — Other Ambulatory Visit: Payer: Self-pay | Admitting: Family Medicine

## 2015-02-21 ENCOUNTER — Other Ambulatory Visit: Payer: Self-pay | Admitting: Family Medicine

## 2015-02-24 ENCOUNTER — Ambulatory Visit (INDEPENDENT_AMBULATORY_CARE_PROVIDER_SITE_OTHER): Payer: Self-pay | Admitting: Family Medicine

## 2015-02-24 ENCOUNTER — Encounter: Payer: Self-pay | Admitting: Family Medicine

## 2015-02-24 VITALS — BP 139/66 | HR 75 | Temp 97.9°F | Ht <= 58 in | Wt 113.4 lb

## 2015-02-24 DIAGNOSIS — E1142 Type 2 diabetes mellitus with diabetic polyneuropathy: Secondary | ICD-10-CM

## 2015-02-24 DIAGNOSIS — E785 Hyperlipidemia, unspecified: Secondary | ICD-10-CM

## 2015-02-24 DIAGNOSIS — IMO0001 Reserved for inherently not codable concepts without codable children: Secondary | ICD-10-CM

## 2015-02-24 DIAGNOSIS — E119 Type 2 diabetes mellitus without complications: Secondary | ICD-10-CM

## 2015-02-24 DIAGNOSIS — E1165 Type 2 diabetes mellitus with hyperglycemia: Secondary | ICD-10-CM

## 2015-02-24 DIAGNOSIS — I10 Essential (primary) hypertension: Secondary | ICD-10-CM

## 2015-02-24 LAB — BASIC METABOLIC PANEL WITH GFR
BUN: 19 mg/dL (ref 7–25)
CALCIUM: 9.7 mg/dL (ref 8.6–10.4)
CO2: 27 mmol/L (ref 20–31)
Chloride: 102 mmol/L (ref 98–110)
Creat: 0.72 mg/dL (ref 0.50–1.05)
GFR, Est African American: >89 mL/min (ref >=60)
GFR, Est Non African American: >89 mL/min (ref >=60)
Glucose, Bld: 111 mg/dL — ABNORMAL HIGH (ref 65–99)
Potassium: 4.3 mmol/L (ref 3.5–5.3)
SODIUM: 140 mmol/L (ref 135–146)

## 2015-02-24 LAB — LIPID PANEL
CHOL/HDL RATIO: 3.9 ratio (ref <=5.0)
Cholesterol: 159 mg/dL (ref 125–200)
HDL: 41 mg/dL — ABNORMAL LOW (ref >=46)
LDL CALC: 91 mg/dL (ref <130)
Triglycerides: 133 mg/dL (ref <150)
VLDL: 27 mg/dL (ref <30)

## 2015-02-24 LAB — POCT GLYCOSYLATED HEMOGLOBIN (HGB A1C): HEMOGLOBIN A1C: 8.6

## 2015-02-24 MED ORDER — LISINOPRIL-HYDROCHLOROTHIAZIDE 10-12.5 MG PO TABS: 2.0000 | ORAL_TABLET | Freq: Every day | ORAL | Status: DC

## 2015-02-24 MED ORDER — ATORVASTATIN CALCIUM 40 MG PO TABS: 40.0000 mg | ORAL_TABLET | Freq: Every day | ORAL | Status: DC

## 2015-02-24 NOTE — Assessment & Plan Note (Signed)
Controlled with no medications. -no change in therapy

## 2015-02-24 NOTE — Assessment & Plan Note (Signed)
Will recheck Lipid profile today since started Lipitor in July 2016.  -continue Atorvastatin 40 mg daily

## 2015-02-24 NOTE — Assessment & Plan Note (Signed)
DM under better control. A1C 8.6 (down from 9.6 in July 2016) -continue Metformin and Glipizide -patient unable to afford more expensive medications (no insurance, not a candidate for orange card) -discussed that she is not fully optimized (goal A1C 7.0) however since improved from last visit will continue dietary changes -patient to follow up with ophthalmologist -foot exam normal today

## 2015-02-24 NOTE — Progress Notes (Signed)
   Subjective:    Patient ID: Erin Small, female    DOB: 03/18/1955, 60 y.o.   MRN: [id number]  HPI  60 y/o Hispanic female presents for routine follow up.   DM - taking Metformin 1000 mg BID and Glyburide 10 mg twice daily, fasting blood sugars are low 100's, no formal exercise (very active at job, works as Engineer, water); needs to reschedule eye appointment (goes yearly), has been cutting down on carbohydrates, eating more salads/vegetables.   Peripheral Neuropathy - Foot numbness has resolved, no further hand numbness.   HTN - has not taken HCTZ-Lisinopril for past week, ran out and need refills, no chest pain, no headaches  HLD - needs refills of Atorvastation  HM - had flu shot in september  Social - no insurance, does not qualify for [employer], not a citizen of the [place]  Review of Systems  Constitutional: Negative for fever, chills and fatigue.  Respiratory: Negative for cough, choking and shortness of breath.   Cardiovascular: Negative for chest pain and leg swelling.  Gastrointestinal: Negative for nausea, vomiting and diarrhea.       Objective:   Physical Exam Filed Vitals:   02/24/15 0951  BP: 139/66  Pulse: 75  Temp: 97.9 F (36.6 C)   Gen: pleasant female, NAD Cardiac: RRR, S1 and S2 present, no murmur, no heaves/thrills Resp: CTAB, normal effort Ext: no edema Foot Exam completed - see Quality Metrics  POC A1C 8.6 (9.6 in July 2015)    Assessment & Plan:  DM (diabetes mellitus), type 2, uncontrolled DM under better control. A1C 8.6 (down from 9.6 in July 2016) -continue Metformin and Glipizide -patient unable to afford more expensive medications (no insurance, not a candidate for orange card) -discussed that she is not fully optimized (goal A1C 7.0) however since improved from last visit will continue dietary changes -patient to follow up with ophthalmologist -foot exam normal today  Diabetic peripheral neuropathy Controlled with no medications. -no change  in therapy  Hyperlipidemia Will recheck Lipid profile today since started Lipitor in July 2016.  -continue Atorvastatin 40 mg daily  HYPERTENSION, BENIGN SYSTEMIC Slight elevated likely to being out of meds -refill of HCTZ-Lisinopril sent to pharmacy -check BMP to monitor renal function

## 2015-02-24 NOTE — Patient Instructions (Signed)
It was nice to see you today.  Diabetes - your blood sugars are under better control, continue to eat healthy, we will recheck your blood sugars in 3 months, schedule your eye exam  Blood pressure - slightly elevated, likely due to having run out of medications, refills sent to pharmacy  Cholesterol - continue Lipitor, check labs today  Follow up in 3 months for yearly physical.

## 2015-02-24 NOTE — Assessment & Plan Note (Signed)
Slight elevated likely to being out of meds -refill of HCTZ-Lisinopril sent to pharmacy -check BMP to monitor renal function

## 2015-02-25 ENCOUNTER — Encounter: Payer: Self-pay | Admitting: Family Medicine

## 2015-02-25 LAB — HEPATITIS C ANTIBODY: HCV AB: NEGATIVE

## 2015-02-25 LAB — HIV ANTIBODY (ROUTINE TESTING W REFLEX): HIV: NONREACTIVE

## 2015-04-25 ENCOUNTER — Other Ambulatory Visit: Payer: Self-pay | Admitting: Family Medicine

## 2015-08-12 ENCOUNTER — Other Ambulatory Visit: Payer: Self-pay | Admitting: Family Medicine

## 2015-08-25 ENCOUNTER — Other Ambulatory Visit: Payer: Self-pay | Admitting: Family Medicine

## 2015-10-05 NOTE — Progress Notes (Signed)
   Subjective:    Patient ID: Erin Small, female    DOB: 1955-06-07, 60 y.o.   MRN: [id number]  HPI 60 y/o female presents for follow up of DM.   Type 2 DM Currently prescribed Glyburide 10 mg BID, Metformin 2500 mg daily. Due for yearly eye exam. 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 Reports poor diet as often traveling for work (eats out for lunch often).   HTN Currently prescribed Lisinopril-HCTZ 10-12.5 (2 tablets daily), pharmacy blood pressures (120's/70's), no chest pain.   HM Due for Influenza (wants to get at work), TDap vaccines, also due for mammogram  Review of Systems  Constitutional: Negative for chills and fatigue.  Respiratory: Negative for chest tightness.   Cardiovascular: Negative for chest pain.  Gastrointestinal: Negative for diarrhea, nausea and vomiting.       Objective:   Physical Exam BP (!) 154/88   Pulse 80   Temp 98.4 F (36.9 C) (Oral)   Wt 111 lb 3.2 oz (50.4 kg)   LMP 10/25/2010   BMI 24.93 kg/m  Gen: pleasant female, NAD HEENT: normocephalic, PERRL, EOMI, no scleral icterus, MMM, neck supple, no adenopathy Cardiac: RRR, S1 and S2 present, no murmur Resp: CTAB, normal effort Ext: no edema Foot Exam: 2+ DP pulses, no ulceration, normal sensation to monofilament testing     Assessment & Plan:  DM (diabetes mellitus), type 2, uncontrolled Uncontrolled. A1C 10.0.  -patient resistant to starting insulin therefore restarted Januvia. Continue Metformin and Glyburide as prescribed. -foot exam unremarkable today -discussed healthy eating habits -return in 3 months to recheck A1C  HYPERTENSION, BENIGN SYSTEMIC Uncontrolled in office. Pharmacy BP's at goal. -continue Lisinopril-HCTZ 20-25 daily -RN visit for BP check, patient to bring home readings to RN visit  Health care maintenance Patient to get Flu vaccine at work. Prescription for TdaP provided to patient. Encouraged to get mammogram.

## 2015-10-06 ENCOUNTER — Ambulatory Visit (INDEPENDENT_AMBULATORY_CARE_PROVIDER_SITE_OTHER): Payer: Self-pay | Admitting: Family Medicine

## 2015-10-06 ENCOUNTER — Encounter: Payer: Self-pay | Admitting: Family Medicine

## 2015-10-06 VITALS — BP 154/88 | HR 80 | Temp 98.4°F | Wt 111.2 lb

## 2015-10-06 DIAGNOSIS — E1165 Type 2 diabetes mellitus with hyperglycemia: Secondary | ICD-10-CM

## 2015-10-06 DIAGNOSIS — I10 Essential (primary) hypertension: Secondary | ICD-10-CM

## 2015-10-06 DIAGNOSIS — Z0000 Encounter for general adult medical examination without abnormal findings: Secondary | ICD-10-CM

## 2015-10-06 DIAGNOSIS — IMO0001 Reserved for inherently not codable concepts without codable children: Secondary | ICD-10-CM

## 2015-10-06 LAB — POCT GLYCOSYLATED HEMOGLOBIN (HGB A1C): Hemoglobin A1C: 10

## 2015-10-06 LAB — VITAMIN B12: Vitamin B-12: 364 pg/mL (ref 200–1100)

## 2015-10-06 MED ORDER — METFORMIN HCL 1000 MG PO TABS: ORAL_TABLET | ORAL | 1 refills | Status: DC

## 2015-10-06 MED ORDER — ATORVASTATIN CALCIUM 40 MG PO TABS: 40.0000 mg | ORAL_TABLET | Freq: Every day | ORAL | 1 refills | Status: DC

## 2015-10-06 MED ORDER — SITAGLIPTIN PHOSPHATE 100 MG PO TABS: 100.0000 mg | ORAL_TABLET | Freq: Every day | ORAL | 2 refills | Status: DC

## 2015-10-06 NOTE — Assessment & Plan Note (Signed)
Uncontrolled. A1C 10.0.  -patient resistant to starting insulin therefore restarted Januvia. Continue Metformin and Glyburide as prescribed. -foot exam unremarkable today -discussed healthy eating habits -return in 3 months to recheck A1C

## 2015-10-06 NOTE — Patient Instructions (Addendum)
It was nice to see you today.  You are due for a Tetanus shot. You have been given a prescription. Please go to the pharmacy to have this completed.  Please have your flu shot completed at work. Bring a receipt of getting the flu shot to my office.  Please get your mammogram. The [facility] to [facility] is [phone].   Please make an appointment with my nurse to recheck your blood pressure. Please continue to check your blood pressure at the pharmacy and bring with you to the next appointment.   Diabetes - please start Januvia, I have sent this to your pharmacy  Medication cost - please call the medication assistance program to see if you qualify for medication assistance.

## 2015-10-06 NOTE — Assessment & Plan Note (Signed)
Uncontrolled in office. Pharmacy BP's at goal. -continue Lisinopril-HCTZ 20-25 daily -RN visit for BP check, patient to bring home readings to RN visit

## 2015-10-06 NOTE — Assessment & Plan Note (Signed)
Patient to get Flu vaccine at work. Prescription for TdaP provided to patient. Encouraged to get mammogram.

## 2015-10-07 ENCOUNTER — Encounter: Payer: Self-pay | Admitting: Family Medicine

## 2015-12-19 ENCOUNTER — Other Ambulatory Visit: Payer: Self-pay | Admitting: Family Medicine

## 2015-12-19 DIAGNOSIS — Z1231 Encounter for screening mammogram for malignant neoplasm of breast: Secondary | ICD-10-CM

## 2015-12-19 LAB — HM DIABETES EYE EXAM

## 2016-01-11 ENCOUNTER — Ambulatory Visit: Payer: Self-pay

## 2016-01-19 ENCOUNTER — Ambulatory Visit
Admission: RE | Admit: 2016-01-19 | Discharge: 2016-01-19 | Disposition: A | Discharge status: Home / Self Care | Payer: No Typology Code available for payment source | Source: Ambulatory Visit | Attending: Family Medicine | Admitting: Family Medicine

## 2016-01-19 ENCOUNTER — Telehealth: Payer: Self-pay | Admitting: Family Medicine

## 2016-01-19 DIAGNOSIS — E1165 Type 2 diabetes mellitus with hyperglycemia: Principal | ICD-10-CM

## 2016-01-19 DIAGNOSIS — Z1231 Encounter for screening mammogram for malignant neoplasm of breast: Secondary | ICD-10-CM

## 2016-01-19 DIAGNOSIS — IMO0001 Reserved for inherently not codable concepts without codable children: Secondary | ICD-10-CM

## 2016-01-19 NOTE — Telephone Encounter (Signed)
Patient called requesting refills on medication:  Januvia 100mg  tab     Please send to        [facility] [address] [place]

## 2016-01-20 MED ORDER — SITAGLIPTIN PHOSPHATE 100 MG PO TABS: 100.0000 mg | ORAL_TABLET | Freq: Every day | ORAL | 2 refills | Status: DC

## 2016-01-20 NOTE — Telephone Encounter (Signed)
Refill sent to pharmacy.   

## 2016-01-24 NOTE — Progress Notes (Signed)
   Subjective:    Patient ID: Erin MenghiniMaria Small, female    DOB: 1955/05/21, 61 y.o.   MRN: [id number]  HPI 61 y/o female presents for follow up of diabetes.  Type 2 DM  Prescribed Glyburide 10 mg BID, Metformin 1500 mg in AM and 1000 mg in PM, and Januvia 100 mg daily (added back at last visit). A1C 10.0 in September 2017. No formal exercise (very active at work, walking), active at work. Diet - decreased portions, eating healthier (more salads), Fasting blood sugars in the low 100's; did have sweats/low blood sugars the first month however no recent episodes.   HTN Prescribed Lisinopril-HCTZ 10-12.5 (2 tablets daily). No chest pain, headache today as has not eaten  HM Due for yearly eye exam (went to see eye doctor in November, [facility])   Review of Systems  Constitutional: Negative for chills, fatigue and fever.  Respiratory: Negative for cough and shortness of breath.   Cardiovascular: Negative for chest pain.       Objective:   Physical Exam BP 124/80   Pulse 71   Temp 97.6 F (36.4 C) (Oral)   Ht 4\' 8"  (1.422 m)   Wt 110 lb 3.2 oz (50 kg)   LMP 10/25/2010   SpO2 95%   BMI 24.71 kg/m   Gen: pleasant female, NAD Cardiac: RRR, S1 and S2 present, no murmur Resp: CTAB, normal effort  POC A1c 7.5      Assessment & Plan:  DM (diabetes mellitus), type 2, uncontrolled Improved. A1C 7.5 -continue metformin, glyburide, and januvia -up to date on eye and foot exams  HYPERTENSION, BENIGN SYSTEMIC Controlled -continue Lisinopril-HCTZ

## 2016-01-27 ENCOUNTER — Ambulatory Visit (INDEPENDENT_AMBULATORY_CARE_PROVIDER_SITE_OTHER): Payer: Self-pay | Admitting: Family Medicine

## 2016-01-27 ENCOUNTER — Encounter: Payer: Self-pay | Admitting: Family Medicine

## 2016-01-27 VITALS — BP 124/80 | HR 71 | Temp 97.6°F | Ht <= 58 in | Wt 110.2 lb

## 2016-01-27 DIAGNOSIS — I10 Essential (primary) hypertension: Secondary | ICD-10-CM

## 2016-01-27 DIAGNOSIS — IMO0001 Reserved for inherently not codable concepts without codable children: Secondary | ICD-10-CM

## 2016-01-27 DIAGNOSIS — E1165 Type 2 diabetes mellitus with hyperglycemia: Secondary | ICD-10-CM

## 2016-01-27 LAB — POCT GLYCOSYLATED HEMOGLOBIN (HGB A1C): HEMOGLOBIN A1C: 7.5

## 2016-01-27 MED ORDER — GLYBURIDE 5 MG PO TABS: ORAL_TABLET | ORAL | 1 refills | Status: DC

## 2016-01-27 MED ORDER — LISINOPRIL-HYDROCHLOROTHIAZIDE 10-12.5 MG PO TABS: 2.0000 | ORAL_TABLET | Freq: Every day | ORAL | 1 refills | Status: DC

## 2016-01-27 NOTE — Patient Instructions (Addendum)
It was nice to see you today.  Diabetes - well controlled, continue to eat healthy, no changes in medications today.  Blood pressure - also well controlled, continue current medications.   Return to the office in 3 months to recheck blood sugars.

## 2016-01-27 NOTE — Assessment & Plan Note (Signed)
Controlled -continue Lisinopril-HCTZ

## 2016-01-27 NOTE — Assessment & Plan Note (Signed)
Improved. A1C 7.5 -continue metformin, glyburide, and januvia -up to date on eye and foot exams

## 2016-02-03 ENCOUNTER — Encounter: Payer: Self-pay | Admitting: Family Medicine

## 2016-02-03 DIAGNOSIS — H269 Unspecified cataract: Secondary | ICD-10-CM | POA: Insufficient documentation

## 2016-06-12 ENCOUNTER — Other Ambulatory Visit: Payer: Self-pay | Admitting: *Deleted

## 2016-06-12 MED ORDER — METFORMIN HCL 1000 MG PO TABS: ORAL_TABLET | ORAL | 1 refills | Status: DC

## 2016-06-20 ENCOUNTER — Encounter: Payer: Self-pay | Admitting: Family Medicine

## 2016-06-20 NOTE — Progress Notes (Signed)
   Subjective:    Patient ID: Erin Small, female    DOB: 04-24-1955, 61 y.o.   MRN: [id number]  HPI 61 y/o female presents for follow up of Type 2 DM. HPI obtained using Video Spanish Interpretor.   Type 2 DM Last seen on 01/27/16. A1C improved to 7.5 at that time. No changes in medications. Currently taking Glyburide 10 mg BID, Metformin 1500 mg in AM and 1000 mg in PM, and Januvia 100 mg daily (has been out of this medication ; Patient has been feeling well. Fasting blood sugars around 120. Diet - trying to decrease the portion size of meals, trying to eat vegetables, not a lot of meat; tries to avoid fast food (but does occassionally due to work).  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   HM Up to date  Social Works out of town frequently (out of town for past 3 months).   Review of Systems  Constitutional: Negative for chills, fatigue and fever.  Respiratory: Negative for cough and shortness of breath.   Cardiovascular: Negative for chest pain.  Gastrointestinal: Negative for diarrhea, nausea and vomiting.       Objective:   Physical Exam BP 138/78   Pulse 75   Temp 98.6 F (37 C) (Oral)   Ht 4\' 8"  (1.422 m)   Wt 108 lb 9.6 oz (49.3 kg)   LMP 10/25/2010   SpO2 98%   BMI 24.35 kg/m   Gen: pleasant female, NAD Cardiac: RRR, S1 and S2 present, no murmur Resp: CTAB, normal effort Neuro: Sensation to monofilament testing intact on hands and feet, grip strength 5/5  MSK: Cervical ROM is full and no tenderness  A1C 9.5      Assessment & Plan:  HYPERTENSION, BENIGN SYSTEMIC Well controlled on Linsinopril-HCTZ.  Diabetic peripheral neuropathy Patient with hand symptoms consistent with diabetic neuropathy. She declines therapy at this time. -consider Gabapentin or  Lyrica if hand/finger numbness continues  DM (diabetes mellitus), type 2, uncontrolled Uncontrolled. A1C up to 9.5. Suspect due to dietary indiscretion. Patient hesitant to add a medication at this time. -will give patient 3 months to improve blood sugars. If A1C remains elevated will need to titrate therapy. Consider addition of GLP-1 agonist.

## 2016-06-22 ENCOUNTER — Ambulatory Visit (INDEPENDENT_AMBULATORY_CARE_PROVIDER_SITE_OTHER): Payer: Self-pay | Admitting: Family Medicine

## 2016-06-22 VITALS — BP 138/78 | HR 75 | Temp 98.6°F | Ht <= 58 in | Wt 108.6 lb

## 2016-06-22 DIAGNOSIS — IMO0001 Reserved for inherently not codable concepts without codable children: Secondary | ICD-10-CM

## 2016-06-22 DIAGNOSIS — E1142 Type 2 diabetes mellitus with diabetic polyneuropathy: Secondary | ICD-10-CM

## 2016-06-22 DIAGNOSIS — I10 Essential (primary) hypertension: Secondary | ICD-10-CM

## 2016-06-22 DIAGNOSIS — E1165 Type 2 diabetes mellitus with hyperglycemia: Secondary | ICD-10-CM

## 2016-06-22 LAB — POCT GLYCOSYLATED HEMOGLOBIN (HGB A1C): Hemoglobin A1C: 9.5

## 2016-06-22 MED ORDER — LISINOPRIL-HYDROCHLOROTHIAZIDE 10-12.5 MG PO TABS: 2.0000 | ORAL_TABLET | Freq: Every day | ORAL | 1 refills | Status: DC

## 2016-06-22 MED ORDER — GLYBURIDE 5 MG PO TABS: ORAL_TABLET | ORAL | 1 refills | Status: DC

## 2016-06-22 MED ORDER — ATORVASTATIN CALCIUM 40 MG PO TABS: 40.0000 mg | ORAL_TABLET | Freq: Every day | ORAL | 1 refills | Status: DC

## 2016-06-22 MED ORDER — SITAGLIPTIN PHOSPHATE 100 MG PO TABS: 100.0000 mg | ORAL_TABLET | Freq: Every day | ORAL | 1 refills | Status: DC

## 2016-06-22 MED ORDER — METFORMIN HCL 1000 MG PO TABS: ORAL_TABLET | ORAL | 1 refills | Status: DC

## 2016-06-22 NOTE — Assessment & Plan Note (Signed)
Well controlled on Linsinopril-HCTZ.

## 2016-06-22 NOTE — Patient Instructions (Addendum)
It was great to see you today.  Return in 3 months for recheck of Diabetes.   I have sent refills to your pharmacy.

## 2016-06-22 NOTE — Assessment & Plan Note (Signed)
Uncontrolled. A1C up to 9.5. Suspect due to dietary indiscretion. Patient hesitant to add a medication at this time. -will give patient 3 months to improve blood sugars. If A1C remains elevated will need to titrate therapy. Consider addition of GLP-1 agonist.

## 2016-06-22 NOTE — Assessment & Plan Note (Signed)
Patient with hand symptoms consistent with diabetic neuropathy. She declines therapy at this time. -consider Gabapentin or Lyrica if hand/finger numbness continues

## 2016-06-23 LAB — CBC
Hematocrit: 41.2 % (ref 34.0–46.6)
Hemoglobin: 13.4 g/dL (ref 11.1–15.9)
MCH: 26.1 pg — ABNORMAL LOW (ref 26.6–33.0)
MCHC: 32.5 g/dL (ref 31.5–35.7)
MCV: 80 fL (ref 79–97)
PLATELETS: 417 10*3/uL — AB (ref 150–379)
RBC: 5.14 x10E6/uL (ref 3.77–5.28)
RDW: 15.1 % (ref 12.3–15.4)
WBC: 5.5 10*3/uL (ref 3.4–10.8)

## 2016-06-23 LAB — LIPID PANEL
CHOL/HDL RATIO: 3.7 ratio (ref 0.0–4.4)
Cholesterol, Total: 142 mg/dL (ref 100–199)
HDL: 38 mg/dL — AB (ref >39)
LDL CALC: 77 mg/dL (ref 0–99)
Triglycerides: 133 mg/dL (ref 0–149)
VLDL CHOLESTEROL CAL: 27 mg/dL (ref 5–40)

## 2016-06-23 LAB — TSH: TSH: 2.24 u[IU]/mL (ref 0.450–4.500)

## 2016-06-25 ENCOUNTER — Encounter: Payer: Self-pay | Admitting: Family Medicine

## 2016-07-03 ENCOUNTER — Other Ambulatory Visit: Payer: Self-pay | Admitting: *Deleted

## 2016-08-21 ENCOUNTER — Other Ambulatory Visit: Payer: Self-pay | Admitting: *Deleted

## 2016-08-21 MED ORDER — ATORVASTATIN CALCIUM 40 MG PO TABS: 40.0000 mg | ORAL_TABLET | Freq: Every day | ORAL | 0 refills | Status: DC

## 2016-09-17 ENCOUNTER — Other Ambulatory Visit: Payer: Self-pay | Admitting: Family Medicine

## 2016-09-17 MED ORDER — LISINOPRIL-HYDROCHLOROTHIAZIDE 10-12.5 MG PO TABS: 2.0000 | ORAL_TABLET | Freq: Every day | ORAL | 1 refills | Status: DC

## 2016-09-17 MED ORDER — GLYBURIDE 5 MG PO TABS: ORAL_TABLET | ORAL | 0 refills | Status: DC

## 2016-09-17 NOTE — Progress Notes (Signed)
Request for  1. Diabeta 5 mg tablet, 2 tab twice a day    Qty: 360 (90 day supply) 2. Lisinopril 10-12.5 mg, two tablets once a day    Qty: 180 (90 day supply)  Will refill both request.  #1 request with no refill                                       #2 request with one refill  Last ov note from Dr Randolm IdolFletke recommended RTC 3 months for uncontrolled DMT2. Will limit Diabeta to just 3 month supply. Patient will need to be seen before further refills.

## 2016-10-21 ENCOUNTER — Other Ambulatory Visit: Payer: Self-pay | Admitting: Family Medicine

## 2016-10-23 ENCOUNTER — Other Ambulatory Visit: Payer: Self-pay | Admitting: Family Medicine

## 2016-10-23 DIAGNOSIS — IMO0001 Reserved for inherently not codable concepts without codable children: Secondary | ICD-10-CM

## 2016-10-23 DIAGNOSIS — E1165 Type 2 diabetes mellitus with hyperglycemia: Principal | ICD-10-CM

## 2016-10-23 MED ORDER — SITAGLIPTIN PHOSPHATE 100 MG PO TABS: 100.0000 mg | ORAL_TABLET | Freq: Every day | ORAL | 3 refills | Status: DC

## 2016-10-31 ENCOUNTER — Other Ambulatory Visit: Payer: Self-pay | Admitting: Family Medicine

## 2016-10-31 ENCOUNTER — Encounter: Payer: Self-pay | Admitting: Family Medicine

## 2016-10-31 DIAGNOSIS — IMO0001 Reserved for inherently not codable concepts without codable children: Secondary | ICD-10-CM

## 2016-10-31 DIAGNOSIS — J309 Allergic rhinitis, unspecified: Secondary | ICD-10-CM

## 2016-10-31 DIAGNOSIS — E1165 Type 2 diabetes mellitus with hyperglycemia: Principal | ICD-10-CM

## 2016-10-31 HISTORY — DX: Allergic rhinitis, unspecified: J30.9

## 2016-10-31 MED ORDER — SITAGLIPTIN PHOSPHATE 100 MG PO TABS: 100.0000 mg | ORAL_TABLET | Freq: Every day | ORAL | 0 refills | Status: DC

## 2016-11-13 ENCOUNTER — Other Ambulatory Visit: Payer: Self-pay | Admitting: *Deleted

## 2016-11-13 DIAGNOSIS — E1165 Type 2 diabetes mellitus with hyperglycemia: Principal | ICD-10-CM

## 2016-11-13 DIAGNOSIS — IMO0001 Reserved for inherently not codable concepts without codable children: Secondary | ICD-10-CM

## 2016-11-14 ENCOUNTER — Telehealth: Payer: Self-pay | Admitting: Family Medicine

## 2016-11-14 DIAGNOSIS — E1165 Type 2 diabetes mellitus with hyperglycemia: Principal | ICD-10-CM

## 2016-11-14 DIAGNOSIS — IMO0001 Reserved for inherently not codable concepts without codable children: Secondary | ICD-10-CM

## 2016-11-14 MED ORDER — SITAGLIPTIN PHOSPHATE 100 MG PO TABS: 100.0000 mg | ORAL_TABLET | Freq: Every day | ORAL | 0 refills | Status: DC

## 2016-11-14 NOTE — Telephone Encounter (Signed)
Medication resent to pharmacy preferred by patient. Jazmin Hartsell,CMA

## 2016-11-14 NOTE — Telephone Encounter (Signed)
[facility] res[facility] ref[facility] to [facility] [address]. [facility]

## 2016-12-18 LAB — HM DIABETES EYE EXAM

## 2016-12-20 ENCOUNTER — Encounter: Payer: Self-pay | Admitting: Family Medicine

## 2016-12-20 ENCOUNTER — Other Ambulatory Visit: Payer: Self-pay

## 2016-12-20 ENCOUNTER — Ambulatory Visit: Payer: Self-pay | Admitting: Family Medicine

## 2016-12-20 VITALS — BP 120/70 | HR 84 | Temp 98.1°F | Ht <= 58 in | Wt 111.0 lb

## 2016-12-20 DIAGNOSIS — I10 Essential (primary) hypertension: Secondary | ICD-10-CM

## 2016-12-20 DIAGNOSIS — E781 Pure hyperglyceridemia: Secondary | ICD-10-CM

## 2016-12-20 DIAGNOSIS — Z79899 Other long term (current) drug therapy: Secondary | ICD-10-CM

## 2016-12-20 DIAGNOSIS — E1165 Type 2 diabetes mellitus with hyperglycemia: Secondary | ICD-10-CM

## 2016-12-20 DIAGNOSIS — E113293 Type 2 diabetes mellitus with mild nonproliferative diabetic retinopathy without macular edema, bilateral: Secondary | ICD-10-CM

## 2016-12-20 LAB — POCT GLYCOSYLATED HEMOGLOBIN (HGB A1C): HEMOGLOBIN A1C: 8.8

## 2016-12-20 MED ORDER — GLYBURIDE 5 MG PO TABS
ORAL_TABLET | ORAL | 3 refills | Status: DC
Start: 2016-12-20 — End: 2017-05-02

## 2016-12-20 MED ORDER — METFORMIN HCL 1000 MG PO TABS
ORAL_TABLET | ORAL | 3 refills | Status: DC
Start: 2016-12-20 — End: 2017-05-02

## 2016-12-20 MED ORDER — LISINOPRIL-HYDROCHLOROTHIAZIDE 10-12.5 MG PO TABS: 2.0000 | ORAL_TABLET | Freq: Every day | ORAL | 3 refills | Status: DC

## 2016-12-20 MED ORDER — ATORVASTATIN CALCIUM 40 MG PO TABS: 40.0000 mg | ORAL_TABLET | Freq: Every day | ORAL | 3 refills | Status: DC

## 2016-12-20 NOTE — Assessment & Plan Note (Signed)
Established problem. Stable. Continue current therapy Checking LDL today.

## 2016-12-20 NOTE — Assessment & Plan Note (Addendum)
Established problem Controlled Continue current therapy regiment. BMET on ACEI

## 2016-12-20 NOTE — Assessment & Plan Note (Signed)
Lab Results  Component Value Date   HGBA1C 8.8 12/20/2016  Established problem that has improved.  Application to manufacturer for Januvia (sitagliptin) since it has demonstrated successful glycemic control in Fall opf 2017. Continue current metformin and glyburide doses and schedules.  Restart Januvia 100 mg daily once supply from manufacturer has arrived at her home.  RTC 4 months for recheck A1c.

## 2016-12-20 NOTE — Assessment & Plan Note (Signed)
Requested ROI from Ophthalmologist

## 2016-12-20 NOTE — Progress Notes (Signed)
   Subjective:    Patient ID: Erin Small, female    DOB: 04/28/1955, 61 y.o.   MRN: [id number] Erin Small is alone Sources of clinical information for visit is/are patient and EMR. Nursing assessment for this office visit was reviewed with the patient for accuracy and revision.  Depression screen PHQ 2/9 12/20/2016  Decreased Interest 0  Down, Depressed, Hopeless 0  PHQ - 2 Score 0  Altered sleeping -  Tired, decreased energy -  Change in appetite -  Feeling bad or failure about yourself  -  Trouble concentrating -  Moving slowly or fidgety/restless -  Suicidal thoughts -  PHQ-9 Score -   Fall Risk  06/22/2016 06/22/2013  Falls in the past year? No No    HPI  CHRONIC DIABETES Onset about 10 years ago.  Sitagliptin was added to glyburide and metformin in 09/17 by Dr Fletke for A1c 10.0% with A1c in 01/2016 of 7.5% resulting.    Disease Monitoring  Blood Sugar Ranges: not checking  Polyuria: no   Visual problems: no   Medication Compliance: Unable to afford Januvia this year ($400/month) Medication Side Effects  Hypoglycemia: yes, about once a month.    Preventitive Health Care             Daily Aspirin: yes              Statin: atorvastatin 40 mg             Dental evaluation in 6-months:             Recent eGFR: > 89             ACEI: Yes  Eye Exam: yes  Foot Exam: no  CHRONIC HYPERTENSION  Disease Monitoring  Blood pressure range: not checking at home  Chest pain: no   Dyspnea: no   Claudication: no   Medication compliance: yes  Medication Side Effects  Lightheadedness: yes   Urinary frequency: no   Edema: no   HYPERLIPIDEMIA  Disease Monitoring: See symptoms for Hypertension  Medications: Compliance- yes RUQ pain- no  Muscle aches- no   Review of Systems No smoking    Objective:   Physical Exam VS reviewed GEN: Alert, Cooperative, Groomed, NAD COR: RRR, No M/G/R, No JVD, Normal PMI size and location Diabetic Foot Exam - Simple   Simple  Foot Form Diabetic Foot exam was performed with the following findings:  Yes 12/20/2016 11:48 AM  Visual Inspection No deformities, no ulcerations, no other skin breakdown bilaterally:  Yes Sensation Testing Intact to touch and monofilament testing bilaterally:  Yes Pulse Check Posterior Tibialis and Dorsalis pulse intact bilaterally:  Yes Comments    Gait: Normal speed, No significant path deviation, Step through +,  Psych: Normal affect/thought/speech/language        Assessment & Plan:  See problem list  

## 2016-12-20 NOTE — Patient Instructions (Signed)
Continue your diabetes medications as you are now taking them. Restart the Januvia once you get it from the manufacturer.  Dr Nieves Barberi will call you if your tests are not good. Otherwise he will send you a letter.  If you sign up for MyChart online, you will be able to see your test results once Dr Zafiro Routson has reviewed them.  If you do not hear from us with in 2 weeks please call our office.  There are a years refill on your diabetes, blood pressue and cholesterol medicines.   I would like to see you again in four months to see how well your diabetes is doing.

## 2016-12-21 ENCOUNTER — Encounter: Payer: Self-pay | Admitting: Family Medicine

## 2016-12-21 DIAGNOSIS — H35413 Lattice degeneration of retina, bilateral: Secondary | ICD-10-CM | POA: Insufficient documentation

## 2016-12-21 HISTORY — DX: Hypercalcemia: E83.52

## 2016-12-21 LAB — LDL CHOLESTEROL, DIRECT: LDL DIRECT: 97 mg/dL (ref 0–99)

## 2016-12-21 LAB — BASIC METABOLIC PANEL
BUN/Creatinine Ratio: 24 — ABNORMAL HIGH (ref 9–23)
BUN: 20 mg/dL (ref 6–24)
CO2: 25 mmol/L (ref 20–29)
Calcium: 10.5 mg/dL — ABNORMAL HIGH (ref 8.7–10.2)
Chloride: 97 mmol/L (ref 96–106)
Creatinine, Ser: 0.82 mg/dL (ref 0.57–1.00)
GFR calc Af Amer: 91 mL/min/{1.73_m2} (ref >59)
GFR calc non Af Amer: 79 mL/min/{1.73_m2} (ref >59)
GLUCOSE: 145 mg/dL — AB (ref 65–99)
POTASSIUM: 5.1 mmol/L (ref 3.5–5.2)
SODIUM: 137 mmol/L (ref 134–144)

## 2016-12-21 LAB — PROTEIN / CREATININE RATIO, URINE
CREATININE, UR: 56.1 mg/dL
Protein, Ur: 5.4 mg/dL
Protein/Creat Ratio: 96 mg/g creat (ref 0–200)

## 2016-12-21 NOTE — Assessment & Plan Note (Signed)
New problem Asymptomatic Mild elevation serum calcium to 10.6 in 2014 that has been resolved on multiple serum calcium measurements in interim.  Prior to most recent serum calcium 9.6 in 02/2015.  No hypercalcemia symptoms.  Patient on Lisinopril/HCTZ.  Will recheck serum calcium next visit.  If persists will check iPTH.  Will not stop HCTZ unless significant rise in serum calcium or symptoms develop that may be attributable to hypercalcemia.

## 2017-01-29 ENCOUNTER — Telehealth: Payer: Self-pay | Admitting: Pharmacist

## 2017-01-29 NOTE — Patient Outreach (Signed)
  01/29/2017  Rockne MenghiniMaria Darrah 13-Sep-1955 [id number]   Patient assistance for Januvia approved via [employer], approved 01/09/2017 through 01/09/2018. 3 month supply shipped 01/14/2017 and delivered 01/18/2017. Called patient to confirm receipt - she confirms. Explained refill process. Patient verbalized understanding. Reviewed dosing instructions with patient. She verbalized understanding and expressed thanks.   Allena Katzaroline E Avo Schlachter, Pharm.D., BCPS PGY2 Ambulatory Care Pharmacy Resident Phone: [phone]

## 2017-02-07 ENCOUNTER — Encounter: Payer: Self-pay | Admitting: Family Medicine

## 2017-05-02 ENCOUNTER — Ambulatory Visit: Payer: Self-pay | Admitting: Family Medicine

## 2017-05-02 ENCOUNTER — Encounter: Payer: Self-pay | Admitting: Family Medicine

## 2017-05-02 DIAGNOSIS — E1165 Type 2 diabetes mellitus with hyperglycemia: Secondary | ICD-10-CM

## 2017-05-02 DIAGNOSIS — IMO0001 Reserved for inherently not codable concepts without codable children: Secondary | ICD-10-CM

## 2017-05-02 LAB — POCT GLYCOSYLATED HEMOGLOBIN (HGB A1C): HEMOGLOBIN A1C: 8.9

## 2017-05-02 MED ORDER — EMPAGLIFLOZIN 25 MG PO TABS: 25.0000 mg | ORAL_TABLET | Freq: Every day | ORAL | 3 refills | Status: DC

## 2017-05-02 MED ORDER — GLYBURIDE 5 MG PO TABS: ORAL_TABLET | ORAL | 3 refills | Status: DC

## 2017-05-02 MED ORDER — METFORMIN HCL 1000 MG PO TABS: ORAL_TABLET | ORAL | 3 refills | Status: DC

## 2017-05-02 MED ORDER — SITAGLIPTIN PHOSPHATE 100 MG PO TABS: 100.0000 mg | ORAL_TABLET | Freq: Every day | ORAL | 3 refills | Status: DC

## 2017-05-02 NOTE — Assessment & Plan Note (Signed)
Established problem. Hemoglobin A1c of 8.9 indicates that diabetes is still poorly-controlled. - Patient was successful in getting Januvia from manufacturer through patient assistance program and has been taking this consistently - Patient is highly motivated to start exercise regimen to help improve glycemic control; also discussed option of meeting with dietitian to optimize diet, but patient is not interested at this time - Discussed insulin and SGLT2 inhibitors as pharmacologic options for improving glycemic control; patient is averse to starting insulin at this time, but is amenable to trying to add a SGLT2 inhibitor - Coordinated with Rayfield Citizenaroline (pharmacist) to help patient complete application to receive Jardiance at low or no cost

## 2017-05-02 NOTE — Progress Notes (Signed)
Date of Visit: 05/02/2017   HPI:  Ms. Erin Small is a 62 y.o. woman here today for follow up of diabetes.  Type 2 Diabetes Mellitus Patient checks her morning blood sugar 2-3 times per week, generally when she feels like her diet hasn't been as healthy. For breakfast, she usually eats oatmeal with papaya and apple but no added sweeteners. For lunch, she eats whatever they provide at work, which is often a sandwich or pizza. She tries to ask for a salad in place of less healthy options, but they are not always able to provide this. She cooks herself dinner and usually has a small portion of boiled beans with vegetables or a salad. She drinks  water throughout the day and very rarely (about 1x per month) will have a soda. She recently obtained an exercise bike from a friend and is very motivated to start exercising regularly. Her initial goal is to exercise for 20 minutes 3-4x per week. Eventually she wants to work up to 60 minutes at a time and exercise 6-7x per week. She denies polydipsia, polyuria, nausea, and tingling/numbness in her hands or feet.  ROS: See HPI.  PMH: HTN, hyperlipidemia, hypercalcemia  PHYSICAL EXAM: BP 110/64   Pulse 64   Temp 98.2 F (36.8 C) (Oral)   Ht 4\' 8"  (1.422 m)   Wt 111 lb (50.3 kg)   LMP 10/25/2010   SpO2 99%   BMI 24.89 kg/m  Gen: Well-appearing, cooperative, sitting comfortably in chair Heart: RRR, no murmurs/rubs/gallops; 2+ radial pulses bilaterally Lungs: CTAB, normal work of breathing Neuro: grossly intact, speech normal Ext: atraumatic, no cyanosis  ASSESSMENT/PLAN:  DM (diabetes mellitus), type 2, uncontrolled (HCC) Established problem. Hemoglobin A1c of 8.9 indicates that diabetes is still poorly-controlled. - Patient was successful in getting Januvia from manufacturer through patient assistance program and has been taking this consistently - Patient is highly motivated to start exercise regimen to help improve glycemic control; also discussed  option of meeting with dietitian to optimize diet, but patient is not interested at this time - Discussed insulin and SGLT2 inhibitors as pharmacologic options for improving glycemic control; patient is averse to starting insulin at this time, but is amenable to trying to add a SGLT2 inhibitor - Coordinated with Rayfield Citizenaroline (pharmacist) to help patient complete application to receive Jardiance at low or no cost  FOLLOW UP: Follow up in 3 months for A1c recheck and check in on exercise goal.

## 2017-05-02 NOTE — Patient Instructions (Signed)
It was great seeing you today. Your hemoglobin A1c today was 8.9 which is still higher than our goal of 7.5. You have a great goal of starting to exercise for 20 minutes 3-4x per week. I look forward to checking in with you to see how this is going at your next visit.  In addition, we would like to start you on a new medication called Jardiance which we are submitting paperwork for so that you can get it at no or low cost. We will send this in as soon as possible so that you can start taking this soon.  Please come back in about 3 months for a follow up visit.

## 2017-05-03 ENCOUNTER — Telehealth: Payer: Self-pay | Admitting: Pharmacist

## 2017-05-03 NOTE — Telephone Encounter (Signed)
Called patient and requested that she bring in her tax return so we can make a copy of it. [facility]. Patient states she can bring by on Tuesday or Wednesday.   Allena Katzaroline E Welles, Pharm.D., BCPS PGY2 Ambulatory Care Pharmacy Resident Phone: [phone]

## 2017-05-15 ENCOUNTER — Encounter: Payer: Self-pay | Admitting: Family Medicine

## 2017-06-21 ENCOUNTER — Telehealth: Payer: Self-pay | Admitting: Pharmacist

## 2017-06-21 NOTE — Telephone Encounter (Signed)
Patient approved through [employer] for [employer] through 06/15/2018.   Allena Katzaroline E Welles, Pharm.D., BCPS PGY2 Ambulatory Care Pharmacy Resident Phone: [phone]

## 2017-06-25 LAB — HM DIABETES EYE EXAM

## 2017-07-12 ENCOUNTER — Encounter: Payer: Self-pay | Admitting: Family Medicine

## 2017-07-12 DIAGNOSIS — H11153 Pinguecula, bilateral: Secondary | ICD-10-CM | POA: Insufficient documentation

## 2017-07-12 HISTORY — DX: Pinguecula, bilateral: H11.153

## 2017-07-29 ENCOUNTER — Encounter: Payer: Self-pay | Admitting: Family Medicine

## 2017-10-17 ENCOUNTER — Ambulatory Visit: Payer: Self-pay | Admitting: Family Medicine

## 2017-10-31 ENCOUNTER — Ambulatory Visit (INDEPENDENT_AMBULATORY_CARE_PROVIDER_SITE_OTHER): Payer: Self-pay | Admitting: Family Medicine

## 2017-10-31 ENCOUNTER — Other Ambulatory Visit: Payer: Self-pay

## 2017-10-31 ENCOUNTER — Telehealth: Payer: Self-pay | Admitting: Pharmacist

## 2017-10-31 ENCOUNTER — Encounter: Payer: Self-pay | Admitting: Family Medicine

## 2017-10-31 VITALS — BP 110/68 | HR 71 | Temp 97.9°F | Ht <= 58 in | Wt 107.0 lb

## 2017-10-31 DIAGNOSIS — E1149 Type 2 diabetes mellitus with other diabetic neurological complication: Secondary | ICD-10-CM

## 2017-10-31 DIAGNOSIS — E7800 Pure hypercholesterolemia, unspecified: Secondary | ICD-10-CM

## 2017-10-31 DIAGNOSIS — E1165 Type 2 diabetes mellitus with hyperglycemia: Secondary | ICD-10-CM

## 2017-10-31 DIAGNOSIS — I10 Essential (primary) hypertension: Secondary | ICD-10-CM

## 2017-10-31 LAB — POCT GLYCOSYLATED HEMOGLOBIN (HGB A1C): HbA1c, POC (controlled diabetic range): 7.3 % — AB (ref 0.0–7.0)

## 2017-10-31 NOTE — Assessment & Plan Note (Addendum)
Adequate blood pressure control.  No evidence of new end organ damage.  Tolerating medication without significant adverse effects.  Plan to continue current blood pressure regiment.  Checking BMET for high risk med (lisinopril)

## 2017-10-31 NOTE — Patient Instructions (Signed)
Great job with getting your diabetes under control  Please keep taking your diabetes medicines like you are.   We will make an application to get your Januvia from the manufacturer.  Your blood pressure is under very good control  Keep taking your blood pressure medicine like your are.   Congratulations for losing weight since your last office visit.  :)

## 2017-10-31 NOTE — Assessment & Plan Note (Signed)
Established problem that has improved.  Lab Results  Component Value Date   HGBA1C 7.3 (A) 10/31/2017  Continue Januvia 100 mg daily (thru manufacturer), metformin 1000 mg - 1.5 tab morning and 1 tab evening, Jardiance 25 mg daily (thru manufacturer) and glipizide 5 mg BID.   Will ask pharmacy to assist with applying for renewal of patient's januvia thru manufacturer.  Patient will run out of Januvia 12/19/17.

## 2017-10-31 NOTE — Telephone Encounter (Addendum)
Contacted by PCP Dr. McDiarmid regarding re-enrollment for [facility] patient assistance through [employer]. Patient's enrollment goes through 01/08/2018, however, her prescription expires 12/19/2017 and they have already shipped 4 shipments, so a new application is required per the [employer].  Called patient; she will swing by [facility] and sign [employer] application that I have left at the front for her signature.   Received signature from Dr. Perley Jain. [employer]ck requires applications be mailed to them instead of faxed. Application placed in the mail today.   Plan:  - Will f/u with [employer] in 7-10 business days to ensure they received the application  Catie Feliz Beam, PharmD PGY2 Ambulatory Care Pharmacy Resident, [facility] Phone: [phone]

## 2017-10-31 NOTE — Progress Notes (Signed)
   Subjective:    Patient ID: Erin Small, female    DOB: 06-30-1955, 62 y.o.   MRN: [id number] Erin Small is alone Sources of clinical information for visit is/are patient and past medical records. Nursing assessment for this office visit was reviewed with the patient for accuracy and revision.  Previous Report(s) Reviewed: historical medical records  Depression screen [facility] 2/9 10/31/2017  Decreased Interest 0  Down, Depressed, Hopeless 0  PHQ - 2 Score 0  Altered sleeping -  Tired, decreased energy -  Change in appetite -  Feeling bad or failure about yourself  -  Trouble concentrating -  Moving slowly or fidgety/restless -  Suicidal thoughts -  PHQ-9 Score -   Fall Risk  06/22/2016 06/22/2013  Falls in the past year? No No     Adult vaccines due  Topic Date Due  . TETANUS/TDAP  10/13/2025  . PNEUMOCOCCAL POLYSACCHARIDE VACCINE AGE 53-64 HIGH RISK  Completed    Diabetes Health Maintenance Due  Topic Date Due  . HEMOGLOBIN A1C  11/01/2017  . FOOT EXAM  12/20/2017  . OPHTHALMOLOGY EXAM  06/26/2018   There are no preventive care reminders to display for this patient.   HPI  HYPERTENSION Disease Monitoring: Blood pressure range-not checking at home  Chest pain no  palpitations- no        Dyspnea- no Medications: Compliance- yes  Lightheadedness - no ,Syncope- no    Edema- no  DIABETES Disease Monitoring: Blood Sugar ranges-120-130s Polyuria/phagia/dipsia- no      Visual problems- no change from chronic changes Medications: Compliance- yes Hypoglycemic symptoms- no  Monitoring Labs and Parameters Last A1C:  Lab Results  Component Value Date   HGBA1C 7.3 (A) 10/31/2017    Last Lipid:     Component Value Date/Time   CHOL 142 06/22/2016 1124   HDL 38 (L) 06/22/2016 1124   LDLDIRECT 97 12/20/2016 1005   LDLDIRECT 142 (H) 04/28/2012 1526    Last Bmet  Potassium  Date Value Ref Range Status  12/20/2016 5.1 3.5 - 5.2 mmol/L Final   Sodium  Date Value  Ref Range Status  12/20/2016 137 134 - 144 mmol/L Final   Creat  Date Value Ref Range Status  02/24/2015 0.72 0.50 - 1.05 mg/dL Final   Creatinine, Ser  Date Value Ref Range Status  12/20/2016 0.82 0.57 - 1.00 mg/dL Final     CrCl cannot be calculated (Patient's most recent lab result is older than the maximum 21 days allowed.).  Last BPs:  BP Readings from Last 3 Encounters:  10/31/17 110/68  05/02/17 110/64  12/20/16 120/70      ROS: See HPI    Review of Systems     Objective:   Physical Exam VS reviewed GEN: Alert, Cooperative, Groomed, NAD Gait: Normal speed, No significant path deviation, Step through +,  Psych: Normal affect/thought/speech/language   Assessment & Plan:  Visit Problem List with A/P  No problem-specific Assessment & Plan notes found for this encounter.

## 2017-11-01 LAB — BASIC METABOLIC PANEL
BUN / CREAT RATIO: 22 (ref 12–28)
BUN: 18 mg/dL (ref 8–27)
CHLORIDE: 99 mmol/L (ref 96–106)
CO2: 25 mmol/L (ref 20–29)
Calcium: 10.5 mg/dL — ABNORMAL HIGH (ref 8.7–10.3)
Creatinine, Ser: 0.81 mg/dL (ref 0.57–1.00)
GFR calc non Af Amer: 79 mL/min/{1.73_m2} (ref >59)
GFR, EST AFRICAN AMERICAN: 91 mL/min/{1.73_m2} (ref >59)
Glucose: 120 mg/dL — ABNORMAL HIGH (ref 65–99)
POTASSIUM: 4.9 mmol/L (ref 3.5–5.2)
SODIUM: 139 mmol/L (ref 134–144)

## 2017-11-01 LAB — LDL CHOLESTEROL, DIRECT: LDL DIRECT: 98 mg/dL (ref 0–99)

## 2017-11-05 ENCOUNTER — Encounter: Payer: Self-pay | Admitting: Family Medicine

## 2017-11-05 NOTE — Assessment & Plan Note (Signed)
Intermittent elevation of serum calcium since 2014.  When elevated, on 0.2 mg/dL higher than ULN. (+) Thiazide and calcium/vit D supplement iatrogenesis possible. 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 TSH WNL 2018 Normal Hgb/Cr/TPro No s/s sarcoid/granulomatous disease.  Plan: Continue to monitor periodically or with concerning symptoms.

## 2017-11-12 ENCOUNTER — Telehealth: Payer: Self-pay | Admitting: Pharmacist

## 2017-11-12 NOTE — Telephone Encounter (Signed)
Contacted [employer] patient assistance regarding Januvia application. Patient has been approved for Januvia assistance through 11/08/2018. Representative informed me that the pharmacy will have the information to ship in 2-3 business days, however, since patient has a supply through 12/19/17, it may be too soon for them to ship.   Contacted patient, informed her of this news. She expressed appreciation.   Will route note to PCP Dr. McDiarmid for FYI.   Catie Feliz Beam, PharmD PGY2 Ambulatory Care Pharmacy Resident, [facility] Phone: [phone]

## 2018-03-20 ENCOUNTER — Encounter: Payer: Self-pay | Admitting: Family Medicine

## 2018-03-20 ENCOUNTER — Other Ambulatory Visit: Payer: Self-pay

## 2018-03-20 ENCOUNTER — Ambulatory Visit (INDEPENDENT_AMBULATORY_CARE_PROVIDER_SITE_OTHER): Payer: Self-pay | Admitting: Family Medicine

## 2018-03-20 VITALS — BP 108/68 | HR 72 | Temp 98.0°F | Ht <= 58 in | Wt 107.0 lb

## 2018-03-20 DIAGNOSIS — E1165 Type 2 diabetes mellitus with hyperglycemia: Secondary | ICD-10-CM

## 2018-03-20 DIAGNOSIS — E781 Pure hyperglyceridemia: Secondary | ICD-10-CM

## 2018-03-20 DIAGNOSIS — E1149 Type 2 diabetes mellitus with other diabetic neurological complication: Secondary | ICD-10-CM

## 2018-03-20 DIAGNOSIS — I10 Essential (primary) hypertension: Secondary | ICD-10-CM

## 2018-03-20 LAB — POCT GLYCOSYLATED HEMOGLOBIN (HGB A1C): HbA1c, POC (controlled diabetic range): 7.3 % — AB (ref 0.0–7.0)

## 2018-03-20 MED ORDER — LISINOPRIL-HYDROCHLOROTHIAZIDE 10-12.5 MG PO TABS: 2.0000 | ORAL_TABLET | Freq: Every day | ORAL | 3 refills | Status: DC

## 2018-03-20 MED ORDER — ATORVASTATIN CALCIUM 40 MG PO TABS: 40.0000 mg | ORAL_TABLET | Freq: Every day | ORAL | 3 refills | Status: DC

## 2018-03-20 MED ORDER — EMPAGLIFLOZIN 25 MG PO TABS: 25.0000 mg | ORAL_TABLET | Freq: Every day | ORAL | 3 refills | Status: DC

## 2018-03-20 MED ORDER — METFORMIN HCL 1000 MG PO TABS: ORAL_TABLET | ORAL | 3 refills | Status: DC

## 2018-03-20 NOTE — Patient Instructions (Signed)
Your A1c was great.  Keep taking your diabetes medications and watching your weight like you are.    I agree with adding increased activity as your ankle will allow.    Refills were sent on your medications.    Our pharmacist, Catie, will make the reapplication to the manufacturer for your Jardiance in May.  Return to see Dr Kaydi Kley in 3 months.     [facility] for the "[facility]" mammogram cost coverage program for mammograms and Pap smears.

## 2018-03-21 NOTE — Assessment & Plan Note (Signed)
Adequate blood pressure control.  No evidence of new end organ damage.  Tolerating medication without significant adverse effects.  Plan to continue current blood pressure regiment.   

## 2018-03-21 NOTE — Assessment & Plan Note (Signed)
Established problem. Hemoglobin A1C Latest Ref Rng & Units 03/20/2018 10/31/2017 05/02/2017  HGBA1C 0.0 - 7.0 % 7.3(A) 7.3(A) 8.9  Some recent data might be hidden   Stable. Good Glycemic control Continue Januvia 100 mg daily (thru manufacturer), metformin 1000 mg - 1.5 tab morning and 1 tab evening, Jardiance 25 mg daily (thru manufacturer) and glipizide 5 mg BID.   Patient's Jardiance approval from manufacturer will run out June 2020.  [facility] pharmacist, Catie, visited with patient during ov.  She plans to submit the reapplication for Jardiance in May 2020.

## 2018-03-21 NOTE — Progress Notes (Signed)
   Subjective:    Patient ID: Erin Small, female    DOB: 1955-10-22, 63 y.o.   MRN: [id number] Erin Small is alone Sources of clinical information for visit is/are patient and past medical records. Nursing assessment for this office visit was reviewed with the patient for accuracy and revision.  Previous Report(s) Reviewed: historical medical records  Depression screen [facility] 2/9 10/31/2017  Decreased Interest 0  Down, Depressed, Hopeless 0  PHQ - 2 Score 0  Altered sleeping -  Tired, decreased energy -  Change in appetite -  Feeling bad or failure about yourself  -  Trouble concentrating -  Moving slowly or fidgety/restless -  Suicidal thoughts -  PHQ-9 Score -   Fall Risk  06/22/2016 06/22/2013  Falls in the past year? No No    Adult vaccines due  Topic Date Due  . TETANUS/TDAP  10/13/2025  . PNEUMOCOCCAL POLYSACCHARIDE VACCINE AGE 55-64 HIGH RISK  Completed    Diabetes Health Maintenance Due  Topic Date Due  . FOOT EXAM  12/20/2017  . OPHTHALMOLOGY EXAM  06/26/2018  . HEMOGLOBIN A1C  09/20/2018    Health Maintenance Due  Topic Date Due  . FOOT EXAM  12/20/2017  . MAMMOGRAM  01/18/2018  . PAP SMEAR-Modifier  05/19/2018     HPI  HYPERTENSION Disease Monitoring: Blood pressure range-not checking at home  Chest pain no  palpitations- no        Dyspnea- no Medications: Compliance- yes  Lightheadedness - no ,Syncope- no    Edema- no  DIABETES Disease Monitoring: Blood Sugar ranges-100-200 Polyuria/phagia/dipsia- no      Visual problems- no change from chronic changes Medications: Compliance- yes Hypoglycemic symptoms- no  Monitoring Labs and Parameters Last A1C:  Lab Results  Component Value Date   HGBA1C 7.3 (A) 03/20/2018    Last Lipid:     Component Value Date/Time   CHOL 142 06/22/2016 1124   HDL 38 (L) 06/22/2016 1124   LDLDIRECT 98 10/31/2017 1155   LDLDIRECT 142 (H) 04/28/2012 1526    Last Bmet  Potassium  Date Value Ref Range Status   10/31/2017 4.9 3.5 - 5.2 mmol/L Final   Sodium  Date Value Ref Range Status  10/31/2017 139 134 - 144 mmol/L Final   Creat  Date Value Ref Range Status  02/24/2015 0.72 0.50 - 1.05 mg/dL Final   Creatinine, Ser  Date Value Ref Range Status  10/31/2017 0.81 0.57 - 1.00 mg/dL Final      Last BPs:  BP Readings from Last 3 Encounters:  03/20/18 108/68  10/31/17 110/68  05/02/17 110/64      ROS: See HPI    Review of Systems     Objective:   Physical Exam VS reviewed GEN: Alert, Cooperative, Groomed, NAD Gait: Normal speed, No significant path deviation, Step through +,  Psych: Normal affect/thought/speech/language   Diabetic Foot Exam - Simple   Simple Foot Form Diabetic Foot exam was performed with the following findings:  Yes 03/20/2018  3:30 PM  Visual Inspection No deformities, no ulcerations, no other skin breakdown bilaterally:  Yes Sensation Testing Intact to touch and monofilament testing bilaterally:  Yes Pulse Check Posterior Tibialis and Dorsalis pulse intact bilaterally:  Yes Comments     Assessment & Plan:  Visit Problem List with A/P  No problem-specific Assessment & Plan notes found for this encounter.

## 2018-05-15 ENCOUNTER — Telehealth: Payer: Self-pay | Admitting: Pharmacist

## 2018-05-15 NOTE — Telephone Encounter (Addendum)
Patient assistance for [employer] runs out in June 2020. Appropriate to begin application process.   Will work with Dr. McDiarmid to get signature on his portion of application. Will mail patient her portion with return envelope. Will also need her proof of income.   Catie Feliz Beam, PharmD, CPP PGY2 Ambulatory Care Pharmacy Resident, [facility] Phone: [phone]

## 2018-06-06 NOTE — Telephone Encounter (Signed)
Patient and provider portions received. Faxing to [employer]    Will follow up with BI in 5-7 business days.   Catie Feliz Beam, PharmD, CPP PGY2 Ambulatory Care Pharmacy Resident, [facility] Phone: [phone]

## 2018-06-13 NOTE — Telephone Encounter (Signed)
Contacted [employer]; patient was APPROVED for [place] assistance through [employer] through 06/16/2019. She can receive any [employer] medications for free through this time. Shipping had not been set up yet.   Will plan to call patient in 2 weeks to ensure she received the medication.   Catie Feliz Beam, PharmD, CPP PGY2 Ambulatory Care Pharmacy Resident, [facility] Phone: [phone]

## 2018-06-26 ENCOUNTER — Telehealth: Payer: Self-pay | Admitting: Pharmacist

## 2018-06-26 NOTE — Telephone Encounter (Signed)
Contacted patient; she confirms that she received 3 month supply of Jardiance in the mail yesterday.   This approval runs through 06/16/2019.   Catie Darnelle Maffucci, PharmD, [place] PGY2 Ambulatory Care Pharmacy Resident, [facility] Phone: [phone]

## 2018-07-06 ENCOUNTER — Other Ambulatory Visit: Payer: Self-pay | Admitting: Family Medicine

## 2018-07-06 DIAGNOSIS — E1165 Type 2 diabetes mellitus with hyperglycemia: Secondary | ICD-10-CM

## 2019-02-05 ENCOUNTER — Other Ambulatory Visit: Payer: Self-pay

## 2019-02-05 ENCOUNTER — Encounter: Payer: Self-pay | Admitting: Family Medicine

## 2019-02-05 ENCOUNTER — Ambulatory Visit (INDEPENDENT_AMBULATORY_CARE_PROVIDER_SITE_OTHER): Payer: Self-pay | Admitting: Family Medicine

## 2019-02-05 VITALS — BP 112/70 | HR 84 | Ht <= 58 in | Wt 106.0 lb

## 2019-02-05 DIAGNOSIS — H35413 Lattice degeneration of retina, bilateral: Secondary | ICD-10-CM

## 2019-02-05 DIAGNOSIS — E785 Hyperlipidemia, unspecified: Secondary | ICD-10-CM

## 2019-02-05 DIAGNOSIS — E1149 Type 2 diabetes mellitus with other diabetic neurological complication: Secondary | ICD-10-CM

## 2019-02-05 DIAGNOSIS — Z1231 Encounter for screening mammogram for malignant neoplasm of breast: Secondary | ICD-10-CM

## 2019-02-05 DIAGNOSIS — I10 Essential (primary) hypertension: Secondary | ICD-10-CM

## 2019-02-05 DIAGNOSIS — E1169 Type 2 diabetes mellitus with other specified complication: Secondary | ICD-10-CM

## 2019-02-05 DIAGNOSIS — E113293 Type 2 diabetes mellitus with mild nonproliferative diabetic retinopathy without macular edema, bilateral: Secondary | ICD-10-CM

## 2019-02-05 LAB — POCT GLYCOSYLATED HEMOGLOBIN (HGB A1C): HbA1c, POC (controlled diabetic range): 9.4 % — AB (ref 0.0–7.0)

## 2019-02-05 MED ORDER — LISINOPRIL-HYDROCHLOROTHIAZIDE 10-12.5 MG PO TABS: 2.0000 | ORAL_TABLET | Freq: Every day | ORAL | 3 refills | Status: DC

## 2019-02-05 NOTE — Patient Instructions (Signed)
Ms Droege  Thank you for coming into the office.   Your A1c has increased to 9.4% from 7.7% in March of last year. It sounds like you have a good idea what in your diet may have contributed to the increase.  It is also likely that being off of your Januvia contributed as well.   I recommend restarting your Januvia when you get it.  Continue all your other diabetes medications.    You blood pressure is under good control.  Continue taking your blood pressure medicine.  Dr Ory Elting sent in a dyears refill of this medicine.

## 2019-02-06 LAB — LIPID PANEL
Chol/HDL Ratio: 3.8 ratio (ref 0.0–4.4)
Cholesterol, Total: 148 mg/dL (ref 100–199)
HDL: 39 mg/dL — ABNORMAL LOW
LDL Chol Calc (NIH): 79 mg/dL (ref 0–99)
Triglycerides: 173 mg/dL — ABNORMAL HIGH (ref 0–149)
VLDL Cholesterol Cal: 30 mg/dL (ref 5–40)

## 2019-02-06 LAB — BASIC METABOLIC PANEL WITH GFR
BUN/Creatinine Ratio: 32 — ABNORMAL HIGH (ref 12–28)
BUN: 29 mg/dL — ABNORMAL HIGH (ref 8–27)
CO2: 20 mmol/L (ref 20–29)
Calcium: 10.5 mg/dL — ABNORMAL HIGH (ref 8.7–10.3)
Chloride: 100 mmol/L (ref 96–106)
Creatinine, Ser: 0.9 mg/dL (ref 0.57–1.00)
GFR calc Af Amer: 80 mL/min/1.73
GFR calc non Af Amer: 69 mL/min/1.73
Glucose: 162 mg/dL — ABNORMAL HIGH (ref 65–99)
Potassium: 4.4 mmol/L (ref 3.5–5.2)
Sodium: 140 mmol/L (ref 134–144)

## 2019-02-06 NOTE — Assessment & Plan Note (Signed)
Reports having an apoointment with ophthalmology in two weeks

## 2019-02-06 NOTE — Assessment & Plan Note (Addendum)
Established problem Uncontrolled Lab Results  Component Value Date   HGBA1C 9.4 (A) 02/05/2019  Up from 7.7% 03/2018. Erin Small attributes to increase bread in diet, being out of work dur to pandemic, and running out of Januvia about a month ago.  Application to [employer] for Januvia completed in office and given to Erin Igo to mail.  Restart Januvia 100 mg daily when it arrives Continue Glyburide 5 mg BID (Max 10 mg BID); Continue Metformin 1500 mg daily, Cont ASA 81 daily; cont Atorvastatin 40 mg daily

## 2019-02-06 NOTE — Assessment & Plan Note (Addendum)
Established problem. Adequate blood pressure control.  No evidence of new end organ damage.  Tolerating medication without significant adverse effects.  Plan to continue current blood pressure regiment.   Checked Lipid panel, BMET and Ur Alb/Cr ratio

## 2019-02-09 ENCOUNTER — Encounter: Payer: Self-pay | Admitting: Family Medicine

## 2019-02-11 LAB — MICROALBUMIN / CREATININE URINE RATIO

## 2019-02-17 LAB — HM DIABETES EYE EXAM

## 2019-06-21 ENCOUNTER — Other Ambulatory Visit: Payer: Self-pay | Admitting: Family Medicine

## 2019-06-21 DIAGNOSIS — E1165 Type 2 diabetes mellitus with hyperglycemia: Secondary | ICD-10-CM

## 2019-07-23 ENCOUNTER — Other Ambulatory Visit (HOSPITAL_COMMUNITY)
Admission: RE | Admit: 2019-07-23 | Discharge: 2019-07-23 | Disposition: A | Discharge status: Home / Self Care | Payer: Self-pay | Source: Ambulatory Visit | Attending: Family Medicine | Admitting: Family Medicine

## 2019-07-23 ENCOUNTER — Other Ambulatory Visit: Payer: Self-pay

## 2019-07-23 ENCOUNTER — Ambulatory Visit (INDEPENDENT_AMBULATORY_CARE_PROVIDER_SITE_OTHER): Payer: Self-pay | Admitting: Family Medicine

## 2019-07-23 ENCOUNTER — Encounter: Payer: Self-pay | Admitting: Family Medicine

## 2019-07-23 VITALS — BP 124/76 | HR 81 | Wt 108.0 lb

## 2019-07-23 DIAGNOSIS — E1149 Type 2 diabetes mellitus with other diabetic neurological complication: Secondary | ICD-10-CM

## 2019-07-23 DIAGNOSIS — Z5989 Other problems related to housing and economic circumstances: Secondary | ICD-10-CM

## 2019-07-23 DIAGNOSIS — L309 Dermatitis, unspecified: Secondary | ICD-10-CM

## 2019-07-23 DIAGNOSIS — Z124 Encounter for screening for malignant neoplasm of cervix: Secondary | ICD-10-CM

## 2019-07-23 DIAGNOSIS — Z598 Other problems related to housing and economic circumstances: Secondary | ICD-10-CM

## 2019-07-23 LAB — POCT UA - MICROALBUMIN
Creatinine, POC: 50 mg/dL
Microalbumin Ur, POC: 30 mg/L

## 2019-07-23 LAB — POCT GLYCOSYLATED HEMOGLOBIN (HGB A1C): HbA1c, POC (controlled diabetic range): 8.6 % — AB (ref 0.0–7.0)

## 2019-07-23 MED ORDER — HYDROCORTISONE 2.5 % EX CREA: TOPICAL_CREAM | Freq: Two times a day (BID) | CUTANEOUS | 1 refills | Status: DC

## 2019-07-23 NOTE — Patient Instructions (Addendum)
We will work on getting a medication called Trulicity from the manufacturer for your diabetes.  It is a once a week injection that should get your A1c below 8.0%.    Dr Michael Ventresca will fax the application for Jardiance (Empagliflozin) to the manufacturer.   If your Pap smear is normal, we will send you a letter letting you know.  If the Pap smear is abnormal, Dr Jailin Moomaw will call to speak with you about it.   Gunnar Fusi with [employer] will call you to set up your mammogram.  There should be no cost to you for the mammogram.   Apply the prescription steroid cream to your left arm skin twice a day. Rub it in well. Keep using it until your rash goes away.  Protect your skin from the sun with sun block of at least a 30 SPF cream.  We prefer cream over lotions because they better moisturize the skin.  Use a stool to prop your left leg on when you have to stand for a prolonged period of time.

## 2019-07-27 DIAGNOSIS — Z5989 Other problems related to housing and economic circumstances: Secondary | ICD-10-CM | POA: Insufficient documentation

## 2019-07-27 LAB — CYTOLOGY - PAP
Comment: NEGATIVE
Diagnosis: NEGATIVE
High risk HPV: NEGATIVE

## 2019-07-27 NOTE — Addendum Note (Signed)
Addended byPerley Jain, Hunter Bachar D on: 07/27/2019 12:29 PM   Modules accepted: Orders

## 2019-07-27 NOTE — Assessment & Plan Note (Signed)
Social Determinant of Health.

## 2019-07-27 NOTE — Assessment & Plan Note (Addendum)
Lab Results  Component Value Date   HGBA1C 8.6 (A) 07/23/2019  Established problem Improved, but not at goal Ideal goal, less than 7.5%, sufficient goal 8.0%.  Current regiment: Metformin 2500 mg daily total; Glyburide 10 mg BID; Sitagliptin 100 mg daily; and Empagliflozin 25 mg daily.    Discussed addition of a GLP-1 inhibitor. Erin Small is interested in using a GLP-1 inhibitor injectable or oral.  She will need to ask for help in obtaining one as she is uninsured. We will send a request for help obtaining a GLP-1 inhibitor to [facility].

## 2019-07-27 NOTE — Progress Notes (Addendum)
Diabetic foot exam was performed with the following findings:   No deformities, ulcerations, or other skin breakdown Normal sensation of 10g monofilament Intact posterior tibialis and dorsalis pedis pulses    Visit Problem List with A/P  Diabetes mellitus type 2 with neurological manifestations (HCC) Lab Results  Component Value Date   HGBA1C 8.6 (A) 07/23/2019  Established problem Improved, but not at goal Ideal goal, less than 7.5%, sufficient goal 8.0%.  Current regiment: Metformin 2500 mg daily total; Glyburide 10 mg BID; Sitagliptin 100 mg daily; and Empagliflozin 25 mg daily.    Discussed addition of a GLP-1 inhibitor. Erin Small is interested in using a GLP-1 inhibitor injectable or oral.  She will need to ask for help in obtaining one as she is uninsured. We will send a request for help obtaining a GLP-1 inhibitor to [facility].   Uninsured Social Determinant of Health.

## 2019-07-28 ENCOUNTER — Encounter: Payer: Self-pay | Admitting: Family Medicine

## 2019-08-04 ENCOUNTER — Telehealth: Payer: Self-pay

## 2019-08-04 DIAGNOSIS — E1149 Type 2 diabetes mellitus with other diabetic neurological complication: Secondary | ICD-10-CM

## 2019-08-04 MED ORDER — EMPAGLIFLOZIN 25 MG PO TABS: 25.0000 mg | ORAL_TABLET | Freq: Every day | ORAL | 3 refills | Status: DC

## 2019-08-04 NOTE — Telephone Encounter (Deleted)
Erin Small - Is there a Fax number where I could send the prescription? Thank you, Tawanna Cooler

## 2019-08-04 NOTE — Addendum Note (Signed)
Addended byPerley Jain, Bryssa Tones D on: 08/04/2019 02:27 PM   Modules accepted: Orders

## 2019-08-04 NOTE — Telephone Encounter (Signed)
If appropriate, can you please send an e-script for a 90 day supply of Jardiance with up to 3 refills to [facility] on [address] in [place], [place] [address].  Pt is enrolled in [employer]'s patient assistance program and a BI rep states that they need a prescription.

## 2019-08-05 NOTE — Telephone Encounter (Signed)
Fax'd Rx Jardiance to BI.

## 2019-08-11 ENCOUNTER — Telehealth: Payer: Self-pay

## 2019-08-11 NOTE — Telephone Encounter (Signed)
ENROLLMENT FOR JARDIANCE PATIENT ASSISTANCE THROUGH THE [employer] CARES PATIENT ASSISTANCE PROGRAM HAS BEEN APPROVED THRU 08/04/20.  CONTACT [employer] CARES AT [phone] FOR QUESTIONS.  MEDICATION IS MAILED TO PATIENTS HOME AND PATIENT MAY CALL IN REFILLS THRU THE AUTOMATED SERVICE-PHARMACY PHONE # AND RX # ARE ON HER PRESCRIPTION BOTTLE.

## 2019-08-11 NOTE — Telephone Encounter (Signed)
Telephoned patient at home number. Mail box not set up. I was unable to leave a voice message with BCCCP contact information.

## 2019-08-20 ENCOUNTER — Other Ambulatory Visit: Payer: Self-pay

## 2019-08-20 DIAGNOSIS — Z1231 Encounter for screening mammogram for malignant neoplasm of breast: Secondary | ICD-10-CM

## 2019-09-23 ENCOUNTER — Other Ambulatory Visit: Payer: Self-pay | Admitting: Family Medicine

## 2019-09-23 DIAGNOSIS — E1165 Type 2 diabetes mellitus with hyperglycemia: Secondary | ICD-10-CM

## 2019-10-01 ENCOUNTER — Other Ambulatory Visit: Payer: Self-pay

## 2019-10-01 ENCOUNTER — Ambulatory Visit
Admission: RE | Admit: 2019-10-01 | Discharge: 2019-10-01 | Disposition: A | Discharge status: Home / Self Care | Payer: No Typology Code available for payment source | Source: Ambulatory Visit | Attending: Obstetrics and Gynecology | Admitting: Obstetrics and Gynecology

## 2019-10-01 ENCOUNTER — Ambulatory Visit: Payer: Self-pay | Admitting: *Deleted

## 2019-10-01 VITALS — BP 124/78 | Temp 97.3°F | Wt 109.9 lb

## 2019-10-01 DIAGNOSIS — Z1239 Encounter for other screening for malignant neoplasm of breast: Secondary | ICD-10-CM

## 2019-10-01 DIAGNOSIS — Z1211 Encounter for screening for malignant neoplasm of colon: Secondary | ICD-10-CM

## 2019-10-01 DIAGNOSIS — Z1231 Encounter for screening mammogram for malignant neoplasm of breast: Secondary | ICD-10-CM

## 2019-10-01 NOTE — Patient Instructions (Signed)
Explained breast self awareness with Rockne Menghini. Patient did not need a Pap smear today due to last Pap smear and HPV typing was 07/23/2019. Let her know BCCCP will cover Pap smears and HPV typing every 5 years unless has a history of abnormal Pap smears. Referred patient to the [facility] for a screening mammogram on the mobile unit. Appointment scheduled Thursday, October 01, 2019 at 0940. Patient escorted to mobile unit following BCCCP appointment for her mammogram. Let patient know the Breast Center will follow up with her within the next couple weeks with results of her mammogram by letter or phone. Rockne Menghini verbalized understanding.  Emy Angevine, Kathaleen Maser, RN 10:38 AM

## 2019-10-01 NOTE — Progress Notes (Signed)
Ms. Brihana Quickel is a 64 y.o. female who presents to [facility] clinic today with no complaints.    Pap Smear: Pap smear not completed today. Last Pap smear was 07/23/2019 at [facility] clinic and was normal with negative HPV. Per patient has no history of an abnormal Pap smear. Last Pap smear result is available in Epic.   Physical exam: Breasts Breasts symmetrical. No skin abnormalities bilateral breasts. No nipple retraction bilateral breasts. No nipple discharge bilateral breasts. No lymphadenopathy. No lumps palpated bilateral breasts. No complaints of pain or tenderness on exam.      Pelvic/Bimanual Pap is not indicated today per BCCCP guidelines.   Smoking History: Patient has never smoked.   Patient Navigation: Patient education provided. Access to services provided for patient through BCCCP program.   Colorectal Cancer Screening: Per patient has never had colonoscopy completed. No complaints today. FIT Test given to patient to complete and return to BCCCP.   Breast and Cervical Cancer Risk Assessment: Patient does not have family history of breast cancer, known genetic mutations, or radiation treatment to the chest before age 62. Patient does not have history of cervical dysplasia, immunocompromised, or DES exposure in-utero.  Risk Assessment    Risk Scores      10/01/2019   Last edited by: Narda Rutherford, LPN   5-year risk: 1.1 %   Lifetime risk: 5.3 %          A: BCCCP exam without pap smear No complaints.  P: Referred patient to the [facility] for a screening mammogram on the mobile unit. Appointment scheduled Thursday, October 01, 2019 at 0940.  Priscille Heidelberg, RN 10/01/2019 10:38 AM

## 2019-10-05 ENCOUNTER — Other Ambulatory Visit: Payer: Self-pay | Admitting: Family Medicine

## 2019-10-05 DIAGNOSIS — E1165 Type 2 diabetes mellitus with hyperglycemia: Secondary | ICD-10-CM

## 2020-01-05 ENCOUNTER — Other Ambulatory Visit: Payer: Self-pay | Admitting: Family Medicine

## 2020-03-15 ENCOUNTER — Other Ambulatory Visit: Payer: Self-pay | Admitting: Family Medicine

## 2020-03-15 DIAGNOSIS — I10 Essential (primary) hypertension: Secondary | ICD-10-CM

## 2020-04-10 ENCOUNTER — Other Ambulatory Visit: Payer: Self-pay | Admitting: Family Medicine

## 2020-04-10 DIAGNOSIS — E1165 Type 2 diabetes mellitus with hyperglycemia: Secondary | ICD-10-CM

## 2020-04-11 ENCOUNTER — Encounter: Payer: Self-pay | Admitting: Family Medicine

## 2020-04-11 DIAGNOSIS — R809 Proteinuria, unspecified: Secondary | ICD-10-CM | POA: Insufficient documentation

## 2020-06-16 ENCOUNTER — Other Ambulatory Visit: Payer: Self-pay

## 2020-06-16 ENCOUNTER — Encounter: Payer: Self-pay | Admitting: Family Medicine

## 2020-06-16 ENCOUNTER — Ambulatory Visit (INDEPENDENT_AMBULATORY_CARE_PROVIDER_SITE_OTHER): Payer: No Typology Code available for payment source | Admitting: Family Medicine

## 2020-06-16 VITALS — BP 116/80 | HR 85 | Ht <= 58 in | Wt 106.0 lb

## 2020-06-16 DIAGNOSIS — Z1211 Encounter for screening for malignant neoplasm of colon: Secondary | ICD-10-CM

## 2020-06-16 DIAGNOSIS — E1165 Type 2 diabetes mellitus with hyperglycemia: Secondary | ICD-10-CM

## 2020-06-16 DIAGNOSIS — I152 Hypertension secondary to endocrine disorders: Secondary | ICD-10-CM

## 2020-06-16 DIAGNOSIS — E1159 Type 2 diabetes mellitus with other circulatory complications: Secondary | ICD-10-CM

## 2020-06-16 DIAGNOSIS — E781 Pure hyperglyceridemia: Secondary | ICD-10-CM

## 2020-06-16 DIAGNOSIS — E1149 Type 2 diabetes mellitus with other diabetic neurological complication: Secondary | ICD-10-CM

## 2020-06-16 DIAGNOSIS — E1169 Type 2 diabetes mellitus with other specified complication: Secondary | ICD-10-CM

## 2020-06-16 DIAGNOSIS — I10 Essential (primary) hypertension: Secondary | ICD-10-CM

## 2020-06-16 DIAGNOSIS — Z5989 Other problems related to housing and economic circumstances: Secondary | ICD-10-CM

## 2020-06-16 DIAGNOSIS — E782 Mixed hyperlipidemia: Secondary | ICD-10-CM

## 2020-06-16 DIAGNOSIS — Z0000 Encounter for general adult medical examination without abnormal findings: Secondary | ICD-10-CM

## 2020-06-16 DIAGNOSIS — E113293 Type 2 diabetes mellitus with mild nonproliferative diabetic retinopathy without macular edema, bilateral: Secondary | ICD-10-CM

## 2020-06-16 LAB — POCT GLYCOSYLATED HEMOGLOBIN (HGB A1C): HbA1c, POC (controlled diabetic range): 8.8 % — AB (ref 0.0–7.0)

## 2020-06-16 MED ORDER — EMPAGLIFLOZIN 25 MG PO TABS: 25.0000 mg | ORAL_TABLET | Freq: Every day | ORAL | 3 refills | Status: DC

## 2020-06-16 MED ORDER — GLIPIZIDE ER 10 MG PO TB24: 10.0000 mg | ORAL_TABLET | Freq: Every day | ORAL | 3 refills | Status: DC

## 2020-06-16 MED ORDER — METFORMIN HCL 1000 MG PO TABS
ORAL_TABLET | ORAL | 3 refills | Status: DC
Start: 2020-06-16 — End: 2021-07-17

## 2020-06-16 MED ORDER — LISINOPRIL-HYDROCHLOROTHIAZIDE 10-12.5 MG PO TABS
2.0000 | ORAL_TABLET | Freq: Every day | ORAL | 3 refills | Status: DC
Start: 2020-06-16 — End: 2021-07-17

## 2020-06-16 MED ORDER — JANUVIA 100 MG PO TABS: 100.0000 mg | ORAL_TABLET | Freq: Every day | ORAL | 3 refills | Status: DC

## 2020-06-16 MED ORDER — ATORVASTATIN CALCIUM 40 MG PO TABS: 40.0000 mg | ORAL_TABLET | Freq: Every day | ORAL | 3 refills | Status: DC

## 2020-06-16 NOTE — Patient Instructions (Signed)
Your blood sugar A1c is 8.8%.  This is a little too high.  It sounds like it may be caused by you running out of your Januvia.   We will send in the enrollment form to [employer] to get your Januvia.   We have change from Glyburide to Glipizide XL.  Glipizide is safer than glyburide as we get older.   Take the Glipizide 10 mg XL once a day, 30 minutes before your largest meal of the day.  Your blood pressure looks great!  Keep taking your blood pressure medication like you are doing.   We are checking your cholesterol and kidneys and calcium with blood work today.  Your got your pneumonia vaccination today.  You can request your Shingles vaccination from your pharmacist.    Please ask your eye doctor to send the results of their examination to Dr Raya Mckinstry's office.

## 2020-06-17 DIAGNOSIS — Z0000 Encounter for general adult medical examination without abnormal findings: Secondary | ICD-10-CM | POA: Insufficient documentation

## 2020-06-17 LAB — BASIC METABOLIC PANEL
BUN/Creatinine Ratio: 28 (ref 12–28)
BUN: 22 mg/dL (ref 8–27)
CO2: 23 mmol/L (ref 20–29)
Calcium: 10 mg/dL (ref 8.7–10.3)
Chloride: 97 mmol/L (ref 96–106)
Creatinine, Ser: 0.78 mg/dL (ref 0.57–1.00)
Glucose: 127 mg/dL — ABNORMAL HIGH (ref 65–99)
Potassium: 4.7 mmol/L (ref 3.5–5.2)
Sodium: 139 mmol/L (ref 134–144)
eGFR: 85 mL/min/{1.73_m2} (ref >59)

## 2020-06-17 LAB — LIPID PANEL
Chol/HDL Ratio: 7 ratio — ABNORMAL HIGH (ref 0.0–4.4)
Cholesterol, Total: 286 mg/dL — ABNORMAL HIGH (ref 100–199)
HDL: 41 mg/dL (ref >39)
LDL Chol Calc (NIH): 203 mg/dL — ABNORMAL HIGH (ref 0–99)
Triglycerides: 218 mg/dL — ABNORMAL HIGH (ref 0–149)
VLDL Cholesterol Cal: 42 mg/dL — ABNORMAL HIGH (ref 5–40)

## 2020-06-17 NOTE — Progress Notes (Signed)
Erin Small is alone Sources of clinical information for visit is/are patient and past medical records. Nursing assessment for this office visit was reviewed with the patient for accuracy and revision.     Previous Report(s) Reviewed: lab reports and office notes  Depression screen [facility] 2/9 06/16/2020  Decreased Interest 3  Down, Depressed, Hopeless 0  PHQ - 2 Score 3  Altered sleeping 0  Tired, decreased energy 0  Change in appetite 0  Feeling bad or failure about yourself  0  Trouble concentrating 0  Moving slowly or fidgety/restless 0  Suicidal thoughts 0  PHQ-9 Score 3    Fall Risk  06/16/2020 06/22/2016 06/22/2013  Falls in the past year? 0 No No    PHQ9 SCORE ONLY 06/16/2020 07/23/2019 02/05/2019  PHQ-9 Total Score 3 0 0    Adult vaccines due  Topic Date Due   TETANUS/TDAP  10/13/2025   PNEUMOCOCCAL POLYSACCHARIDE VACCINE AGE 346-64 HIGH RISK  Completed    Health Maintenance Due  Topic Date Due   Pneumococcal Vaccine 35-5 Years old (1 - PCV) Never done   Zoster Vaccines- Shingrix (1 of 2) Never done   COVID-19 Vaccine (3 - Booster for Moderna series) 10/24/2019   OPHTHALMOLOGY EXAM  02/17/2020   COLONOSCOPY (Pts 45-69yrs Insurance coverage will need to be confirmed)  06/13/2020      History/P.E. limitations: communication barrier Language (English as second language).  Patient declined interpreter services for the office visit.   Adult vaccines due  Topic Date Due   TETANUS/TDAP  10/13/2025   PNEUMOCOCCAL POLYSACCHARIDE VACCINE AGE 346-64 HIGH RISK  Completed    Diabetes Health Maintenance Due  Topic Date Due   OPHTHALMOLOGY EXAM  02/17/2020   FOOT EXAM  07/22/2020   HEMOGLOBIN A1C  12/16/2020    Health Maintenance Due  Topic Date Due   Pneumococcal Vaccine 21-102 Years old (1 - PCV) Never done   Zoster Vaccines- Shingrix (1 of 2) Never done   COVID-19 Vaccine (3 - Booster for Moderna series) 10/24/2019   OPHTHALMOLOGY EXAM  02/17/2020   COLONOSCOPY (Pts 45-45yrs  Insurance coverage will need to be confirmed)  06/13/2020     Chief Complaint  Patient presents with   Diabetes

## 2020-06-17 NOTE — Assessment & Plan Note (Signed)
Requires lower cost diabetes regiment and enrollment in medication assistance programs for any newer on-patent antidiabetic meds.

## 2020-06-17 NOTE — Assessment & Plan Note (Signed)
Uninsured.  Apparently her income too much for [facility] enrollment.   She will not be receiving Medicare per her report.   She is unable to afford Prevnar20. Advissed her about Shingrix and usual cost at pharmacy.  After review of CRC screening options, she chose FIT testing.

## 2020-06-17 NOTE — Assessment & Plan Note (Addendum)
Established problem Basic Metabolic Panel:    Component Value Date/Time   NA 139 06/16/2020 1135   K 4.7 06/16/2020 1135   CL 97 06/16/2020 1135   CO2 23 06/16/2020 1135   BUN 22 06/16/2020 1135   CREATININE 0.78 06/16/2020 1135   CREATININE 0.72 02/24/2015 1006   GLUCOSE 127 (H) 06/16/2020 1135   GLUCOSE 111 (H) 02/24/2015 1006   CALCIUM 10.0 06/16/2020 1135    Well Controlled. No signs of complications, medication side effects, or red flags. Continue current medications and other regiments.

## 2020-06-17 NOTE — Assessment & Plan Note (Signed)
Established problem. Uncontrolled. Lipid Panel     Component Value Date/Time   CHOL 286 (H) 06/16/2020 1135   TRIG 218 (H) 06/16/2020 1135   HDL 41 06/16/2020 1135   CHOLHDL 7.0 (H) 06/16/2020 1135   CHOLHDL 3.9 02/24/2015 1006   VLDL 27 02/24/2015 1006   LDLCALC 203 (H) 06/16/2020 1135   LDLDIRECT 98 10/31/2017 1155   LDLDIRECT 142 (H) 04/28/2012 1526   LABVLDL 42 (H) 06/16/2020 1135   Patient reported taking her atorvastatin, but I am concerned that she may have run out.  Refills for her atorvastatin sent to pharmacy.   Will recheck with LDL-D next office visit.

## 2020-06-17 NOTE — Assessment & Plan Note (Signed)
Established problem Lab Results  Component Value Date   HGBA1C 8.8 (A) 06/16/2020  Last A1c 07/2019 8.6%.  A1c 2020 was 7.3%. 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 She said she had contacted the [employer] patient assistance program about three months ago concerning renewal of her Januvia, but did not receive the enrollment form until recently. She is going to soon run out of Jardiance she gets though the manufacturers assistance program.  Recommend Ms Allaire restart Alma Friendly as soon as she can.  She gave me a [employer] patient assitance enrollment form.  I will pass this form onto our pharmacy team.   Ms Ishikawa is concerned that she will run out of her Jardiance soon because the BI pharma assistance program form will arrive late like the [employer] form.  I will ask our pharmacy team to discuss her options for renewal of her Jardiance for [facility] pharma.   Ms Berman agreed to changing her glyburide to Glipizide XL.  She was instructed to stop glyburide and start Gipizide XL 10 mg daily.   She is to continue her metformin 1000 mg tab, 1.5 tab in morning and 1 tab in evening.

## 2020-06-17 NOTE — Assessment & Plan Note (Signed)
Ms Swalley reports that she has an appointment for an eye evaluation on 07/09/20.  I asked that she have the doctor send their evaluation record to our office.

## 2020-06-20 ENCOUNTER — Telehealth: Payer: Self-pay | Admitting: Family Medicine

## 2020-06-20 NOTE — Telephone Encounter (Signed)
I spoke with Erin Small about her elevated lipids.  She has just now restarted her atorvastatin which has been helpful in lowering her lipids in the past.   We will recehck her LDL-D at next office visit in 3 months.

## 2020-06-21 ENCOUNTER — Telehealth: Payer: Self-pay

## 2020-06-21 NOTE — Telephone Encounter (Signed)
Spoke with patient about re-enrolling in patient assistance for jardiance, with company [facility]. Approval expires in July. Pt is able to stop by office (family medicine) on Friday to sign application. Application will be in pharmacy box. Pt has about a month left of medication and is aware the sooner we can send in the application, the sooner she can get re-enrolled. We currently do not have any samples of the 25mg .  I also let pt know that her [employer] application for was placed in the mail today and will most likely take about a month or so for enrollment. Pt expressed understanding.

## 2020-06-21 NOTE — Progress Notes (Signed)
Submitted application for JANUVIA 100MG  to [facility] for patient assistance.   Application was mailed to company.  Phone: [phone]

## 2020-06-24 ENCOUNTER — Telehealth: Payer: Self-pay | Admitting: Family Medicine

## 2020-06-24 ENCOUNTER — Other Ambulatory Visit: Payer: Self-pay | Admitting: Family Medicine

## 2020-06-24 DIAGNOSIS — Z1211 Encounter for screening for malignant neoplasm of colon: Secondary | ICD-10-CM

## 2020-06-24 NOTE — Telephone Encounter (Signed)
Patient came in signed paperwork for assistance with medication.  Paperwork is in the pharmacy box.

## 2020-06-24 NOTE — Progress Notes (Signed)
Order for Fecal Immunochemistry test

## 2020-06-24 NOTE — Telephone Encounter (Signed)
Submitted application for JARDIANCE to [employer] ([employer]) for patient assistance.   Phone: [phone]  

## 2020-06-25 LAB — FECAL OCCULT BLOOD, IMMUNOCHEMICAL: Fecal Occult Bld: NEGATIVE

## 2020-06-28 NOTE — Telephone Encounter (Signed)
Received notification from [employer]) regarding approval for [employer]. Patient assistance approved from 08/05/20 to 08/05/21.  Re-enrollment will begin immediately once current enrollment ends 08/04/20.  Phone: [phone]

## 2020-07-21 NOTE — Progress Notes (Signed)
Received notification from [facility] regarding approval for JANUVIA 100MG . Patient assistance approved from 07/01/20 to 06/30/21.  Medication shipped a 90 day supply to pt home 07/06/20. Refills will be handled by patient or I.  Phone: [phone]

## 2021-04-03 ENCOUNTER — Other Ambulatory Visit: Payer: Self-pay | Admitting: Family Medicine

## 2021-05-10 ENCOUNTER — Other Ambulatory Visit (HOSPITAL_COMMUNITY): Payer: Self-pay

## 2021-05-23 ENCOUNTER — Other Ambulatory Visit (HOSPITAL_COMMUNITY): Payer: Self-pay

## 2021-06-12 ENCOUNTER — Other Ambulatory Visit: Payer: Self-pay | Admitting: Family Medicine

## 2021-06-12 DIAGNOSIS — E1149 Type 2 diabetes mellitus with other diabetic neurological complication: Secondary | ICD-10-CM

## 2021-06-12 DIAGNOSIS — E781 Pure hyperglyceridemia: Secondary | ICD-10-CM

## 2021-06-22 ENCOUNTER — Encounter: Payer: Self-pay | Admitting: Family Medicine

## 2021-06-22 ENCOUNTER — Ambulatory Visit (INDEPENDENT_AMBULATORY_CARE_PROVIDER_SITE_OTHER): Payer: Self-pay | Admitting: Family Medicine

## 2021-06-22 DIAGNOSIS — R809 Proteinuria, unspecified: Secondary | ICD-10-CM

## 2021-06-22 DIAGNOSIS — E1169 Type 2 diabetes mellitus with other specified complication: Secondary | ICD-10-CM

## 2021-06-22 DIAGNOSIS — Z79899 Other long term (current) drug therapy: Secondary | ICD-10-CM

## 2021-06-22 DIAGNOSIS — I152 Hypertension secondary to endocrine disorders: Secondary | ICD-10-CM

## 2021-06-22 DIAGNOSIS — E782 Mixed hyperlipidemia: Secondary | ICD-10-CM

## 2021-06-22 DIAGNOSIS — E1149 Type 2 diabetes mellitus with other diabetic neurological complication: Secondary | ICD-10-CM

## 2021-06-22 DIAGNOSIS — E113293 Type 2 diabetes mellitus with mild nonproliferative diabetic retinopathy without macular edema, bilateral: Secondary | ICD-10-CM

## 2021-06-22 DIAGNOSIS — E1159 Type 2 diabetes mellitus with other circulatory complications: Secondary | ICD-10-CM

## 2021-06-22 LAB — POCT GLYCOSYLATED HEMOGLOBIN (HGB A1C): HbA1c, POC (controlled diabetic range): 8 % — AB (ref 0.0–7.0)

## 2021-06-22 NOTE — Patient Instructions (Addendum)
Your A1c is better.   Keep taking your diabetes medications like you are.   Your blood pressure is under good control.  Keep taking your blood pressure medicines like you are.   Please keep taking your atorvastatin to keep your cholesterol down.  Dr Kord Monette will complete the paperwork for Jardiance and send into the company.   We are checking your kidneys today with tests of blood and urine.  We will see you back in 6 months to a year.

## 2021-06-23 ENCOUNTER — Encounter: Payer: Self-pay | Admitting: Family Medicine

## 2021-06-23 LAB — BASIC METABOLIC PANEL
BUN/Creatinine Ratio: 28 (ref 12–28)
BUN: 27 mg/dL (ref 8–27)
CO2: 22 mmol/L (ref 20–29)
Calcium: 10.1 mg/dL (ref 8.7–10.3)
Chloride: 97 mmol/L (ref 96–106)
Creatinine, Ser: 0.95 mg/dL (ref 0.57–1.00)
Glucose: 107 mg/dL — ABNORMAL HIGH (ref 70–99)
Potassium: 4.6 mmol/L (ref 3.5–5.2)
Sodium: 138 mmol/L (ref 134–144)
eGFR: 67 mL/min/{1.73_m2} (ref >59)

## 2021-06-23 LAB — MICROALBUMIN / CREATININE URINE RATIO
Creatinine, Urine: 54.9 mg/dL
Microalb/Creat Ratio: 19 mg/g creat (ref 0–29)
Microalbumin, Urine: 10.5 ug/mL

## 2021-06-23 LAB — LDL CHOLESTEROL, DIRECT: LDL Direct: 97 mg/dL (ref 0–99)

## 2021-06-23 NOTE — Assessment & Plan Note (Addendum)
Established problem. Adequate blood pressure control.  No evidence of new end organ damage.  Tolerating medication without significant adverse effects.  Plan to continue current blood pressure medication regiment.   Checking Basic Metabolic Panel and microalbumin

## 2021-06-23 NOTE — Assessment & Plan Note (Signed)
LDL 97 mg/dL Established problem Well Controlled and is at goal LDL < 100. No signs of complications, medication side effects, or red flags. Continue current atorvastatin 40 mg daily.

## 2021-06-23 NOTE — Assessment & Plan Note (Signed)
Lab Results  Component Value Date   HGBA1C 8.0 (A) 06/22/2021  Established problem that has improved and has meet goal of <= 8.0%.  Continue current medication regiment. Fax'd to [facility] application patient assistance Jardiance 25 mg tablet #90 RF - 3

## 2021-06-23 NOTE — Progress Notes (Signed)
Erin Small is alone Sources of clinical information for visit is/are patient. Nursing assessment for this office visit was reviewed with the patient for accuracy and revision.     Previous Report(s) Reviewed: none     06/22/2021    9:29 AM  Depression screen PHQ 2/9  Decreased Interest 0  Down, Depressed, Hopeless 0  PHQ - 2 Score 0  Altered sleeping 0  Tired, decreased energy 0  Change in appetite 0  Feeling bad or failure about yourself  0  Trouble concentrating 0  Moving slowly or fidgety/restless 0  Suicidal thoughts 0  PHQ-9 Score 0   Flowsheet Row Office Visit from 06/22/2021 in [facility] Office Visit from 06/16/2020 in [facility] Office Visit from 01/27/2016 in [facility]  Thoughts that you would be better off dead, or of hurting yourself in some way Not at all Not at all Not at all  PHQ-9 Total Score 0 3 0          06/16/2020    9:31 AM 06/22/2016   10:11 AM 06/22/2013    8:47 AM  Fall Risk   Falls in the past year? 0 No No       06/22/2021    9:29 AM 06/16/2020    9:31 AM 07/23/2019    9:06 AM  PHQ9 SCORE ONLY  PHQ-9 Total Score 0 3 0    Adult vaccines due  Topic Date Due   TETANUS/TDAP  10/13/2025    Health Maintenance Due  Topic Date Due   Zoster Vaccines- Shingrix (1 of 2) Never done   OPHTHALMOLOGY EXAM  02/17/2020   COLONOSCOPY (Pts 45-77yrs Insurance coverage will need to be confirmed)  06/13/2020   FOOT EXAM  07/22/2020      History/P.E. limitations: none  Adult vaccines due  Topic Date Due   TETANUS/TDAP  10/13/2025    Diabetes Health Maintenance Due  Topic Date Due   OPHTHALMOLOGY EXAM  02/17/2020   FOOT EXAM  07/22/2020   HEMOGLOBIN A1C  12/22/2021    Health Maintenance Due  Topic Date Due   Zoster Vaccines- Shingrix (1 of 2) Never done   OPHTHALMOLOGY EXAM  02/17/2020   COLONOSCOPY (Pts 45-30yrs Insurance coverage will need to be confirmed)  06/13/2020   FOOT  EXAM  07/22/2020    Diabetic Foot Exam - Simple   Simple Foot Form  06/22/2021 10:00 AM  Visual Inspection No deformities, no ulcerations, no other skin breakdown bilaterally: Yes Sensation Testing Intact to touch and monofilament testing bilaterally: Yes Pulse Check Posterior Tibialis and Dorsalis pulse intact bilaterally: Yes Comments      Chief Complaint  Patient presents with   Follow-up

## 2021-07-04 ENCOUNTER — Other Ambulatory Visit: Payer: Self-pay

## 2021-07-04 MED ORDER — JANUVIA 100 MG PO TABS
100.0000 mg | ORAL_TABLET | Freq: Every day | ORAL | 3 refills | Status: DC
  Filled 2021-07-04: qty 30, 30d supply, fill #0

## 2021-07-06 ENCOUNTER — Telehealth: Payer: Self-pay | Admitting: Family Medicine

## 2021-07-06 NOTE — Telephone Encounter (Signed)
Forms where placed in PCP box by front staff. Aquilla Solian, CMA

## 2021-07-06 NOTE — Telephone Encounter (Signed)
Patient dropped off income documents. Documents placed in Dr. McDiarmid's box .

## 2021-07-16 ENCOUNTER — Other Ambulatory Visit: Payer: Self-pay | Admitting: Family Medicine

## 2021-07-16 DIAGNOSIS — I10 Essential (primary) hypertension: Secondary | ICD-10-CM

## 2021-07-17 ENCOUNTER — Other Ambulatory Visit: Payer: Self-pay | Admitting: Family Medicine

## 2021-07-17 DIAGNOSIS — E1165 Type 2 diabetes mellitus with hyperglycemia: Secondary | ICD-10-CM

## 2021-07-18 ENCOUNTER — Telehealth: Payer: Self-pay

## 2021-07-18 NOTE — Telephone Encounter (Signed)
Received RE-ENROLLMENT notification from [employer]) regarding approval for [employer]. Patient assistance approved from 08/06/21 to 06/22/22.  MEDICATION WILL SHIP TO PT'S HOME. NO AUTO REFILLS, PT MAY CALL COMPANY TO REQUEST REFILLS WHEN DUE.  Phone: [phone]

## 2021-08-21 NOTE — Telephone Encounter (Signed)
*  CORRECTION*  ENROLLED 08/06/21-05/20/22

## 2021-09-05 ENCOUNTER — Other Ambulatory Visit: Payer: Self-pay | Admitting: Family Medicine

## 2021-09-05 ENCOUNTER — Telehealth: Payer: Self-pay

## 2021-09-05 DIAGNOSIS — E781 Pure hyperglyceridemia: Secondary | ICD-10-CM

## 2021-09-05 NOTE — Telephone Encounter (Signed)
Received notification from [facility] regarding RE ENROLLMENT approval for JANUVIA. Patient assistance approved from 07/20/21 to 07/20/22.  MEDICATION DELIVERED TO PT'S HOME 07/25/21.  Phone: [phone]

## 2021-09-09 ENCOUNTER — Other Ambulatory Visit: Payer: Self-pay | Admitting: Family Medicine

## 2021-09-09 DIAGNOSIS — E1149 Type 2 diabetes mellitus with other diabetic neurological complication: Secondary | ICD-10-CM

## 2022-03-27 ENCOUNTER — Encounter (INDEPENDENT_AMBULATORY_CARE_PROVIDER_SITE_OTHER): Payer: Self-pay | Admitting: Pharmacist

## 2022-03-27 ENCOUNTER — Encounter: Payer: Self-pay | Admitting: Pharmacist

## 2022-03-27 VITALS — BP 148/68 | HR 75 | Wt 105.0 lb

## 2022-03-27 DIAGNOSIS — E1149 Type 2 diabetes mellitus with other diabetic neurological complication: Secondary | ICD-10-CM

## 2022-03-27 LAB — POCT GLYCOSYLATED HEMOGLOBIN (HGB A1C): HbA1c, POC (controlled diabetic range): 8.3 % — AB (ref 0.0–7.0)

## 2022-03-27 NOTE — Patient Instructions (Addendum)
Hoy colocamos un monitor continuo de glucosa en [employer]. Si el sensor se cae antes de tiempo, colquelo en una bolsa de plstico y trigalo a Copy. Evite altas dosis de cido saliclico (aspirina) y productos con vitamina C mientras [place] el sensor. Contine controlando su nivel de glucosa en sangre y tomando los medicamentos con normalidad.  We placed a Freestyle Libre Continuous Blood Glucose Monitor today. If the sensor falls off early, put it in a plastic bag and bring back to clinic. Avoid high dose salicylic acid (aspirin) and vitamin C products while wearing sensor. Continue checking blood glucose and taking medications as normal.  Aplicacin de sensores Si [place] la aplicacin, puede tocar Ayuda en el men principal para acceder a un tutorial en la aplicacin sobre cmo aplicar un sensor. Consulte a continuacin las instrucciones sobre cmo Architect. 1. Aplique los sensores solo en la parte posterior de la parte superior del brazo. Si se coloca en otras reas, es posible que el sensor no funcione correctamente y proporcione lecturas inexactas. Evite reas con cicatrices, lunares, estras o bultos.  Sensor Application If using the App, you can tap Help in the Main Menu to access an in-app tutorial on applying a Sensor. See below for instructions on how to download the app. Apply Sensors only on the back of your upper arm. If placed in other areas, the Sensor may not function properly and could give you inaccurate readings. Avoid areas with scars, moles, stretch marks, or lumps.   2. Seleccione un rea de piel que generalmente permanezca plana durante sus actividades diarias normales (sin doblarse ni doblarse). Elija un lugar que est al menos a 2,5 cm (1 pulgada) de distancia de los lugares de inyeccin. Para evitar molestias o irritacin de la piel, debe seleccionar un sitio diferente al utilizado ms recientemente. 3. Milus Banister lugar de aplicacin con un jabn  comn, squelo y luego lmpielo con una toallita con alcohol. Esto ayudar a eliminar cualquier residuo aceitoso que pueda impedir que el sensor se Futures trader. Deje que el sitio se seque al aire antes de continuar. Nota: El rea DEBE estar limpia y seca, o es posible que el sensor no permanezca encendido durante todo el tiempo de uso especificado en el inserto del sensor. 4. Desenrosque la tapa del aplicador de sensor y djela a un lado. 5. Coloque el aplicador del sensor sobre el sitio preparado y presione firmemente hacia abajo para aplicar el sensor a su cuerpo. 6. Retire suavemente el aplicador del sensor de su cuerpo. El sensor ahora debera estar adherido a su piel. 7. Asegrese de que el sensor est seguro despus de la aplicacin. Vuelva a colocar la tapa en el aplicador del sensor. Deseche el aplicador de sensor usado de acuerdo con las regulaciones locales.  2. Select an area of skin that generally stays flat during your normal daily activities (no bending or folding). Choose a site that is at least 1 inch (2.5 cm) away from any injection sites. To prevent discomfort or skin irritation, you should select a different site other than the one most recently used. 3.Wash application site using a plain soap, dry, and then clean with an alcohol wipe. This will help remove any oily residue that may prevent the sensor from sticking properly. Allow site to air dry before proceeding. Note: The area MUST be clean and dry, or the Sensor may not stay on for the full wear duration specified by your Sensor insert. 4. Unscrew the  cap from the Sensor Applicator and set the cap aside.  5. Place the Sensor Applicator over the prepared site and push down firmly to apply the Sensor to your body. 6. Gently pull the Sensor Applicator away from your body. The Sensor should now be attached to your skin. 7. Make sure the Sensor is secure after application. Put the cap back on the Sensor Applicator. Discard  the used Engineer, civil (consulting) according to local regulations.  Qu pasa si mi sensor se cae o si no funciona? Llame al equipo de atencin al cliente de [employer] al [phone] Disponible los 7 das de la semana de 8 a. m. a 8 p. m. EST, excepto feriados Si se le caen varios sensores antes de los [place], [place] con [place] al [phone]  What If My Sensor Falls Off or What If My Sensor Isn't Working? Call [place] Team at [phone] Available 7 days a week from 8AM-8PM EST, excluding holidays If yo have multiple sensors fall off prior to 14 days of use, contact [place] at [phone]  Usando la aplicacin para conectarse a un nuevo sensor: 1. Desde el men en la esquina superior izquierda de la pantalla, toque escanear nuevo sensor 2. Siga las indicaciones en la pantalla. Si su sensor no se sincroniza, intente mover su telfono lentamente alrededor del sensor. Las fundas del telfono pueden Biochemist, clinical. Esta ser la nica vez que tendr que escanear el sensor hasta que aplique un nuevo sensor en [address]. 3. Habr un perodo de inicio de 60 minutos hasta que la aplicacin muestre su lectura de glucosa.  Using the app to connect to a new sensor: From menu in top left corner of screen, tap scan new sensor Follow the prompts on the screen. If your sensor does not sync, try moving your phone slowly around the sensor. Phone cases may affect scanning. This will be the only time you have to scan the sensor until you apply a new sensor in 14 days.  There will be a 60 minute start up period until the app will display your glucose reading

## 2022-03-27 NOTE — Assessment & Plan Note (Signed)
LIBERATE Study:  -Patient provided verbal consent to participate in the study. Consent documented in electronic medical record.  -Provided education on Libre 3 CGM. Collaborated to ensure Elenor Legato 3 app was downloaded on patient's phone. Educated on how to place sensor every 14 days, patient placed first sensor correctly and verbalized understanding of use, removal, and how to place next sensor. Discussed alarms. 8 sensors provided for a 3 month supply. Educated to contact the office if the sensor falls off early and replacements are needed before their next [employer].  Diabetes diagnosed in 2011, currently uncontrolled with an A1c of 8.5%. Patient is able to verbalize appropriate hypoglycemia management plan. Patient reports adherence to medications. Control is suboptimal due to not regularly checking blood sugar.  - Initiated continuous glucose monitoring with Freestyle Libre 3 - Continued DPP-4 Januvia (sitagliptin) 100 mg daily - Continued glipizide 10 mg daily -Continued SGLT2-I Jardiance (empagliflozin) 25 mg daily. Counseled on sick day rules. -Continued metformin 1500 mg in the mornings and 1000 mg in the evenings -Patient educated on purpose, proper use, and potential adverse effects  -Extensively discussed pathophysiology of diabetes, recommended lifestyle interventions, dietary effects on blood sugar control.  -Counseled on s/sx of and management of hypoglycemia.

## 2022-03-27 NOTE — Research (Signed)
S:     Chief Complaint  Patient presents with   Medication Management    Liberate study enrollment   67 y.o. female who presents for diabetes evaluation, education, and management in the context of the LIBERATE Study.   PMH is significant for hypertension, hyperlipidemia, diabetes.  Patient was referred and last seen by Primary Care Provider, Dr. McDiarmid, on 06/22/2021.  At last visit, patients blood pressure and LDL were both at goal on current medications, however, diabetes remained uncontrolled with an A1c of 8.0%.  Today she was screened for the Liberate study and found to have a qualifying (> 8) A1C of 8.3   Today, patient arrives in good spirits and presents without any assistance.  Patient reports Diabetes was diagnosed in 2011.   Current diabetes medications include: Jardiance (empagliflozin) 25 mg daily, glipizide 10 mg daily, Januvia (sitagliptin) 100 mg daily, metformin 1000 mg (1.5 tablets in morning, 1 tablet in evening) Current hypertension medications include: lisinopril-HCTZ 10-12.5 two tablets daily Current hyperlipidemia medications include: atorvastatin 40 mg daily  Patient reports adherence to taking all medications as prescribed.   Patient denies monitoring blood sugar. Patient denies hypoglycemic events.  Patient reported dietary habits: reports eating rice 1-2 times per week, drinks water and occasionally will drink juice, likes strawberries, grapes and oranges.  O:   Review of Systems  All other systems reviewed and are negative.   Physical Exam Constitutional:      Appearance: Normal appearance.  Pulmonary:     Effort: Pulmonary effort is normal.  Neurological:     Mental Status: She is alert.  Psychiatric:        Mood and Affect: Mood normal.        Behavior: Behavior normal.    Lab Results  Component Value Date   HGBA1C 8.3 (A) 03/27/2022    POC A1c Today: 8.3%  Vitals:   03/27/22 0933 03/27/22 0937  BP: (!) 157/67 (!) 148/68   Pulse: 75     Lipid Panel   Clinical Atherosclerotic Cardiovascular Disease (ASCVD): No  The 10-year ASCVD risk score (Arnett DK, et al., 2019) is: 24.2%   Values used to calculate the score:     Age: 43 years     Sex: Female     Is Non-Hispanic African American: No     Diabetic: Yes     Tobacco smoker: No     Systolic Blood Pressure: 123456 mmHg     Is BP treated: Yes     HDL Cholesterol: 41 mg/dL     Total Cholesterol: 286 mg/dL   A/P:  LIBERATE Study:  -Patient provided verbal consent to participate in the study. Consent documented in electronic medical record.  -Provided education on Libre 3 CGM. Collaborated to ensure Elenor Legato 3 app was downloaded on patient's phone. Educated on how to place sensor every 14 days, patient placed first sensor correctly and verbalized understanding of use, removal, and how to place next sensor. Discussed alarms. 8 sensors provided for a 3 month supply. Educated to contact the office if the sensor falls off early and replacements are needed before their next [employer].  Diabetes diagnosed in 2011, currently uncontrolled with an A1c of 8.5%. Patient is able to verbalize appropriate hypoglycemia management plan. Patient reports adherence to medications. Control is suboptimal due to not regularly checking blood sugar.  - Initiated continuous glucose monitoring with Freestyle Libre 3 - Continued DPP-4 Januvia (sitagliptin) 100 mg daily - Continued glipizide 10 mg daily -Continued SGLT2-I  Jardiance (empagliflozin) 25 mg daily. Counseled on sick day rules. -Continued metformin 1500 mg in the mornings and 1000 mg in the evenings -Patient educated on purpose, proper use, and potential adverse effects  -Extensively discussed pathophysiology of diabetes, recommended lifestyle interventions, dietary effects on blood sugar control.  -Counseled on s/sx of and management of hypoglycemia.  -Next A1c anticipated 06/2022.  Hypertension Patient diagnosed in  2008, currently with uncontrolled blood pressure readings in office today. Goal blood pressure <130/80 mmHg. Patient occasionally checks blood pressure in pharmacies and reports systolic blood pressure readings ~120 mmHg.  - Continued lisinopril-HCTZ 10-12.5 mg once daily - Counseled patient to continue to monitor blood pressure and make a log of readings - Patient educated on purpose, proper use and potential adverse effects.  Following instruction patient verbalized understanding of treatment plan.   Written patient instructions provided. Patient verbalized understanding of treatment plan.  Total time in face to face counseling 30 minutes.    Follow-up:  Pharmacist in 2 weeks. Patient seen with Louanne Belton PharmD PGY-1 Pharmacy Resident and Estelle June, PharmD Candidate.

## 2022-03-28 NOTE — Progress Notes (Signed)
Reviewed and agree with Dr Koval's plan.   

## 2022-04-03 ENCOUNTER — Telehealth: Payer: Self-pay | Admitting: Pharmacist

## 2022-04-03 NOTE — Telephone Encounter (Signed)
LIBERATE Study  Patient completed first study visit for the LIBERATE CGM Study. Contacted patient to discuss CGM tolerability. Confirmed HIPAA identifiers.   Date of Download: 04/03/2022 % Time CGM is active: 52% 1 week of data Average Glucose: 145 mg/dL Glucose Management Indicator: 6.8  Glucose Variability: 33.7% (goal <36%) Time in Goal:  - Time in range 70-180: 76% - Time above range: 22% - Time below range: 2% Observed patterns: Nightly lows of 53-high 60s most nights.  Alarms have notified her and patient is being awakened.  Patient confirms Elenor Legato 3 sensors is working and glucose values are transmitting appropriately. Denies any questions or concerns.   Advised patient to STOP Glipizide  Phone call planned in 1 week.  Visit 04/24/2022

## 2022-04-04 NOTE — Telephone Encounter (Signed)
Reviewed and agree with Dr Koval's plan.   

## 2022-04-24 ENCOUNTER — Ambulatory Visit (INDEPENDENT_AMBULATORY_CARE_PROVIDER_SITE_OTHER): Payer: Self-pay | Admitting: Pharmacist

## 2022-04-24 VITALS — BP 135/79 | HR 79 | Wt 104.0 lb

## 2022-04-24 DIAGNOSIS — E1149 Type 2 diabetes mellitus with other diabetic neurological complication: Secondary | ICD-10-CM

## 2022-04-24 NOTE — Progress Notes (Signed)
S:     Chief Complaint  Patient presents with   Medication Management    T2DM/LIBERATE   67 y.o. female who presents for diabetes evaluation, education, and management.  PMH is significant for T2DM, HLD.  Patient was referred and last seen by Primary Care Provider, Dr. McDiarmid, on 06/22/21.  At last visit, Patient was enrolled in [place] study, Libre 3 sensor was applied to her arm. During phone call, patient was discontinued on glipizide after CGM reported multiple low readings  Today, patient arrives in good spirits and presents without any assistance.   Patient reports diabetes was diagnosed in 2011.   Current diabetes medications include: Jardiance (empagliflozin) 25 mg daily, Januvia (sitagliptin) 100 mg daily, metformin 1000 mg (1.5 tablets in morning, 1 tablet in evening)  Current hypertension medications include: lisinopril-HCTZ 10-12.5 two tablets daily  Current hyperlipidemia medications include: atorvastatin 40 mg daily   Patient reports adherence to taking all medications as prescribed.   Do you feel that your medications are working for you? yes Have you been experiencing any side effects to the medications prescribed? no Do you have any problems obtaining medications due to transportation or finances? no Insurance coverage: [employer] ([place] [facility])  Patient denies hypoglycemic events. CGM data shows some instances of lows, but patient reports it is due to sleeping on the side her sensor is on. Does not experience symptoms of low sugars, attribute lows to compression false readings.  Patient denies nocturia (nighttime urination).  Patient denies neuropathy (nerve pain). Patient denies visual changes. Patient denies self foot exams.   Patient reported dietary habits: Eats a smaller amount in each meal, but eats more meals throughout the day  Within the past 12 months, did you worry whether your food would run out before you got money to buy more?  no Within the past 12 months, did the food you bought run out, and you didn't have money to get more? no Patient-reported exercise habits: Began walking 2-3 days a week  O:   Review of Systems  All other systems reviewed and are negative.   Physical Exam Constitutional:      Appearance: Normal appearance.  Pulmonary:     Effort: Pulmonary effort is normal.     Breath sounds: Normal breath sounds.  Neurological:     Mental Status: She is alert.  Psychiatric:        Mood and Affect: Mood normal.        Behavior: Behavior normal.        Thought Content: Thought content normal.        Judgment: Judgment normal.     Freestyle Libre 3 CGM Download:  % Time CGM is active: 96% Average Glucose: 139 mg/dL Glucose Management Indicator: 6.6%  Glucose Variability: 32.8% (goal <36%) Time in Goal:  - Time in range 70-180: 82% - Time above range: 17% (14% High 181-250 mg/dL, 3% Very high 161 mg/dL) - Time below range: 1% (attributed to compression false reading from sleeping on sensor)   Lab Results  Component Value Date   HGBA1C 8.3 (A) 03/27/2022   Vitals:   04/24/22 0851  BP: 135/79  Pulse: 79  SpO2: 98%    Lipid Panel     Component Value Date/Time   CHOL 286 (H) 06/16/2020 1135   TRIG 218 (H) 06/16/2020 1135   HDL 41 06/16/2020 1135   CHOLHDL 7.0 (H) 06/16/2020 1135   CHOLHDL 3.9 02/24/2015 1006   VLDL 27 02/24/2015 1006  LDLCALC 203 (H) 06/16/2020 1135   LDLDIRECT 97 06/22/2021 1008   LDLDIRECT 142 (H) 04/28/2012 1526    Clinical Atherosclerotic Cardiovascular Disease (ASCVD): No  The 10-year ASCVD risk score (Arnett DK, et al., 2019) is: 20.6%   Values used to calculate the score:     Age: 22 years     Sex: Female     Is Non-Hispanic African American: No     Diabetic: Yes     Tobacco smoker: No     Systolic Blood Pressure: 135 mmHg     Is BP treated: Yes     HDL Cholesterol: 41 mg/dL     Total Cholesterol: 286 mg/dL    A/P: Diabetes diagnosed in  2011. Patient is able to verbalize appropriate hypoglycemia management plan. Medication adherence appears good. Control is optimal due to GMI reading of 6.6% and average glucose reading of 139 mg/dL on CGM report. -Continued DPP-4 Januvia (sitagliptin) 100 mg daily -Continued SGLT2-I Jardiance (empagliflozin) 25 mg daily. Counseled on sick day rules. -Continued metformin 1500 mg in the mornings and 1000 mg in the evenings -Extensively discussed pathophysiology of diabetes, recommended lifestyle interventions, dietary effects on blood sugar control.  -Counseled on s/sx of and management of hypoglycemia.  -Next A1c anticipated 07/05/22.   Written patient instructions provided. Patient verbalized understanding of treatment plan.  Total time in face to face counseling 20 minutes.    Follow-up:  Pharmacist 07/05/22. PCP clinic visit in 07/05/22.  Patient seen with Jerry Caras, PharmD PGY-1 Pharmacy Resident and Revonda Standard, PharmD Candidate.

## 2022-04-24 NOTE — Assessment & Plan Note (Signed)
Diabetes diagnosed in 2011. Patient is able to verbalize appropriate hypoglycemia management plan. Medication adherence appears good. Control is optimal due to GMI reading of 6.6% and average glucose reading of 139 mg/dL on CGM report. -Continued DPP-4 Januvia (sitagliptin) 100 mg daily -Continued SGLT2-I Jardiance (empagliflozin) 25 mg daily. Counseled on sick day rules. -Continued metformin 1500 mg in the mornings and 1000 mg in the evenings -Extensively discussed pathophysiology of diabetes, recommended lifestyle interventions, dietary effects on blood sugar control.  -Counseled on s/sx of and management of hypoglycemia.

## 2022-04-24 NOTE — Patient Instructions (Signed)
It was great seeing you today!  We have no changes for you today, keep up the good work!

## 2022-04-26 NOTE — Progress Notes (Signed)
Reviewed and agree with Dr Koval's plan.   

## 2022-07-05 ENCOUNTER — Ambulatory Visit (INDEPENDENT_AMBULATORY_CARE_PROVIDER_SITE_OTHER): Payer: Self-pay | Admitting: Family Medicine

## 2022-07-05 ENCOUNTER — Encounter: Payer: Self-pay | Admitting: Family Medicine

## 2022-07-05 ENCOUNTER — Ambulatory Visit (INDEPENDENT_AMBULATORY_CARE_PROVIDER_SITE_OTHER): Payer: Self-pay | Admitting: Pharmacist

## 2022-07-05 ENCOUNTER — Encounter: Payer: Self-pay | Admitting: Pharmacist

## 2022-07-05 VITALS — BP 130/75 | HR 75 | Ht <= 58 in | Wt 101.8 lb

## 2022-07-05 DIAGNOSIS — I152 Hypertension secondary to endocrine disorders: Secondary | ICD-10-CM

## 2022-07-05 DIAGNOSIS — E1169 Type 2 diabetes mellitus with other specified complication: Secondary | ICD-10-CM

## 2022-07-05 DIAGNOSIS — E1159 Type 2 diabetes mellitus with other circulatory complications: Secondary | ICD-10-CM

## 2022-07-05 DIAGNOSIS — Z13820 Encounter for screening for osteoporosis: Secondary | ICD-10-CM

## 2022-07-05 DIAGNOSIS — Z1212 Encounter for screening for malignant neoplasm of rectum: Secondary | ICD-10-CM

## 2022-07-05 DIAGNOSIS — Z1231 Encounter for screening mammogram for malignant neoplasm of breast: Secondary | ICD-10-CM

## 2022-07-05 DIAGNOSIS — E2839 Other primary ovarian failure: Secondary | ICD-10-CM

## 2022-07-05 DIAGNOSIS — Z1211 Encounter for screening for malignant neoplasm of colon: Secondary | ICD-10-CM

## 2022-07-05 DIAGNOSIS — E782 Mixed hyperlipidemia: Secondary | ICD-10-CM

## 2022-07-05 DIAGNOSIS — E113293 Type 2 diabetes mellitus with mild nonproliferative diabetic retinopathy without macular edema, bilateral: Secondary | ICD-10-CM

## 2022-07-05 DIAGNOSIS — E1149 Type 2 diabetes mellitus with other diabetic neurological complication: Secondary | ICD-10-CM

## 2022-07-05 LAB — POCT GLYCOSYLATED HEMOGLOBIN (HGB A1C): HbA1c, POC (controlled diabetic range): 7.4 % — AB (ref 0.0–7.0)

## 2022-07-05 NOTE — Assessment & Plan Note (Signed)
IBERATE Study:  - 8 sensors provided for a 3 month supply. Educated to contact the office if the sensor falls off early and replacements are needed before their next [employer].   Diabetes longstanding currently with good control, improved by ~ 1 point in A1C in the last 3 months. Patient is able to verbalize appropriate hypoglycemia management plan. Medication adherence appears good.  -Continued DPP-4 Januvia (sitagliptin) 100 mg daily -Continued SGLT2-I Jardiance (empagliflozin) 25 mg daily.  -Continued metformin 1500 mg in the mornings and 1000 mg in the evenings

## 2022-07-05 NOTE — Assessment & Plan Note (Signed)
Hypertension longstanding currently with systolic slightly higher than blood pressure goal of <130/80 mmHg.  Goal of < 140/90 may be appropriate at this time. Medication adherence good.  -Continue Lisinopril hydrochlorothiazide BMET today per PCP order.

## 2022-07-05 NOTE — Patient Instructions (Addendum)
Your Blood pressure is doing well. Your A1c is 7.3%.  Your blood sugars are under very good control.  Great job!  [facility] to [facility] and to talk with you about having your bone strength testing to look for osteoporosis.   We are checking your kidneys, cholesterol, and urine for protein.    OsCal-D one tablet a day for bone health.

## 2022-07-05 NOTE — Assessment & Plan Note (Signed)
ASCVD risk - primary prevention in patient with diabetes and hypertension. Last LDL > 65 year old - Lipid panel today.  high intensity statin indicated.  -Continued atorvastatin 40 mg.

## 2022-07-05 NOTE — Progress Notes (Signed)
S:     Chief Complaint  Patient presents with   Medication Management    Liberate - CGM 3 month study visit   67 y.o. female who presents for diabetes evaluation, education, and management in the context of the LIBERATE Study.  PMH is significant for Hypertension, Hyperlipidemia, and diabetes.  Patient was also seen by Primary Care Provider, Dr. McDiarmid, this morning.   At last visit, patient was doing well based on CGM and no changes to Diabetes regimen were made.   Today, patient arrives in good spirits and presents without any assistance.   Patient reports Diabetes was diagnosed in 2011.    Current diabetes medications include: Jardiance (empagliflozin) 25 mg daily, Januvia (sitagliptin) 100 mg daily, metformin 1000 mg (1.5 tablets in morning, 1 tablet in evening)   Current hypertension medications include: lisinopril-HCTZ 10-12.5 two tablets daily   Current hyperlipidemia medications include: atorvastatin 40 mg daily  Patient reports adherence to taking all medications as prescribed.  She noted a change in her coverage or drug costs in the next month.  We discussed that I would involve Siri Cole, CPhT for assistance.    Do you feel that your medications are working for you? yes Have you been experiencing any side effects to the medications prescribed? no  Patient hypoglycemic events.  Only recorded low readings appear to be "compression lows" that occur during sleep and resolve when patient repositions.   Patient denies nocturia (nighttime urination).  Patient denies neuropathy (nerve pain). Patient denies visual changes. Patient denies self foot exams.   Patient reported dietary habits: Eating less carbs (breads, pasta, potatoes) and also eating smaller amount in each meal, but eats more meals throughout the day    Patient-reported exercise habits: consistent with walking multiple days per week.    O:   Review of Systems  All other systems reviewed  and are negative.   Physical Exam Constitutional:      Appearance: Normal appearance.  Pulmonary:     Effort: Pulmonary effort is normal.  Neurological:     Mental Status: She is alert.  Psychiatric:        Mood and Affect: Mood normal.        Behavior: Behavior normal.        Thought Content: Thought content normal.        Judgment: Judgment normal.    BP 130/75  Lab Results  Component Value Date   HGBA1C 7.4 (A) 07/05/2022     Lipid Panel     Component Value Date/Time   CHOL 286 (H) 06/16/2020 1135   TRIG 218 (H) 06/16/2020 1135   HDL 41 06/16/2020 1135   CHOLHDL 7.0 (H) 06/16/2020 1135   CHOLHDL 3.9 02/24/2015 1006   VLDL 27 02/24/2015 1006   LDLCALC 203 (H) 06/16/2020 1135   LDLDIRECT 97 06/22/2021 1008   LDLDIRECT 142 (H) 04/28/2012 1526    Clinical Atherosclerotic Cardiovascular Disease (ASCVD): No  The 10-year ASCVD risk score (Arnett DK, et al., 2019) is: 19.2%   Values used to calculate the score:     Age: 16 years     Sex: Female     Is Non-Hispanic African American: No     Diabetic: Yes     Tobacco smoker: No     Systolic Blood Pressure: 130 mmHg     Is BP treated: Yes     HDL Cholesterol: 41 mg/dL     Total Cholesterol: 286 mg/dL    [employer]  3 CGM Download:  % Time CGM is active: 95% Average Glucose: 149 mg/dL Glucose Management Indicator: 6.9%  Glucose Variability: 33.9% (goal <36%) Time in Goal:  - Time in range 70-180: 75% - Time above range: 25% (21% High 181-250 mg/dL, 4% Very high 784 mg/dL) - Time below range: 0% (a small number of low readings ~ 70 attributed to compression false low  reading from sleeping on sensor)   A/P:  LIBERATE Study:  - 8 sensors provided for a 3 month supply. Educated to contact the office if the sensor falls off early and replacements are needed before their next [employer].   Diabetes longstanding currently with good control, improved by ~ 1 point in A1C in the last 3 months. Patient is  able to verbalize appropriate hypoglycemia management plan. Medication adherence appears good.  -Continued DPP-4 Januvia (sitagliptin) 100 mg daily -Continued SGLT2-I Jardiance (empagliflozin) 25 mg daily.  -Continued metformin 1500 mg in the mornings and 1000 mg in the evenings  -Patient educated on purpose, proper use, and potential adverse effects.  -Extensively discussed pathophysiology of diabetes, recommended lifestyle interventions, dietary effects on blood sugar control.  -Counseled on s/sx of and management of hypoglycemia.  -Next A1c anticipated 3 months at Liberate 6 month visit.    ASCVD risk - primary prevention in patient with diabetes and hypertension. Last LDL > 27 year old - Lipid panel today.  high intensity statin indicated.  -Continued atorvastatin 40 mg.   Hypertension longstanding currently with systolic slightly higher than blood pressure goal of <130/80 mmHg.  Goal of < 140/90 may be appropriate at this time. Medication adherence good.  -Continue Lisinopril hydrochlorothiazide BMET today per PCP order.    Written patient instructions provided. Patient verbalized understanding of treatment plan.  Total time in face to face counseling 22 minutes.    Follow-up:  Pharmacist 3 months. PCP clinic visit in [place].

## 2022-07-05 NOTE — Progress Notes (Signed)
Reviewed and agree with Dr Koval's plan.   

## 2022-07-05 NOTE — Patient Instructions (Addendum)
  Fue agradable verte hoy!  Su objetivo de Production assistant, radio es 80-130 antes de comer y [place] de 180 despus de comer.  Cambios de medicacin: Contine con todos los [employer], sin cambios.   Contine con el buen trabajo con dieta y ejercicio. Trate de llevar una dieta rica en verduras, frutas y carnes magras (pollo, pavo, pescado). Trate de limitar el consumo de sal comiendo verduras frescas o congeladas (en lugar de enlatadas), enjuague las verduras enlatadas antes de cocinarlas y no agregue sal adicional a las comidas.   It was nice to see you today!  Your goal blood sugar is 80-130 before eating and less than 180 after eating.  Medication Changes: Continue all medications as previous - no changes   Keep up the good work with diet and exercise. Aim for a diet full of vegetables, fruit and lean meats (chicken, [place], fish). Try to limit salt intake by eating fresh or frozen vegetables (instead of canned), rinse canned vegetables prior to cooking and do not add any additional salt to meals.

## 2022-07-06 ENCOUNTER — Encounter: Payer: Self-pay | Admitting: Family Medicine

## 2022-07-06 LAB — BASIC METABOLIC PANEL
BUN/Creatinine Ratio: 28 (ref 12–28)
BUN: 22 mg/dL (ref 8–27)
CO2: 21 mmol/L (ref 20–29)
Calcium: 10 mg/dL (ref 8.7–10.3)
Chloride: 99 mmol/L (ref 96–106)
Creatinine, Ser: 0.78 mg/dL (ref 0.57–1.00)
Glucose: 115 mg/dL — ABNORMAL HIGH (ref 70–99)
Potassium: 4.7 mmol/L (ref 3.5–5.2)
Sodium: 139 mmol/L (ref 134–144)
eGFR: 84 mL/min/{1.73_m2} (ref >59)

## 2022-07-06 LAB — LIPID PANEL
Chol/HDL Ratio: 3.4 ratio (ref 0.0–4.4)
Cholesterol, Total: 131 mg/dL (ref 100–199)
HDL: 38 mg/dL — ABNORMAL LOW (ref >39)
LDL Chol Calc (NIH): 68 mg/dL (ref 0–99)
Triglycerides: 146 mg/dL (ref 0–149)
VLDL Cholesterol Cal: 25 mg/dL (ref 5–40)

## 2022-07-06 LAB — MICROALBUMIN / CREATININE URINE RATIO
Creatinine, Urine: 40.3 mg/dL
Microalb/Creat Ratio: 8 mg/g creat (ref 0–29)
Microalbumin, Urine: 3.2 ug/mL

## 2022-07-06 NOTE — Assessment & Plan Note (Signed)
Established problem. Adequate blood pressure control.  No evidence of new end organ damage.  Tolerating medication without significant adverse effects.  Plan to continue current blood pressure medication regiment.   

## 2022-07-06 NOTE — Assessment & Plan Note (Signed)
Established problem. Adequate glycemic control.  Pt is tolerating the current medication regiment. Continue current treatment plan.    Agree with Dr Macky Lower assessment and recommendations

## 2022-07-06 NOTE — Progress Notes (Signed)
Erin Small is alone Sources of clinical information for visit is/are patient and Consultation with Dr Raymondo Band. Nursing assessment for this office visit was reviewed with the patient for accuracy and revision.     Previous Report(s) Reviewed: none     07/05/2022    8:38 AM  Depression screen PHQ 2/9  Decreased Interest 0  Down, Depressed, Hopeless 0  PHQ - 2 Score 0  Altered sleeping 1  Tired, decreased energy 0  Change in appetite 0  Feeling bad or failure about yourself  0  Trouble concentrating 0  Moving slowly or fidgety/restless 0  Suicidal thoughts 0  PHQ-9 Score 1   Flowsheet Row Office Visit from 07/05/2022 in [facility] Office Visit from 06/22/2021 in [facility] Office Visit from 06/16/2020 in [facility]  Thoughts that you would be better off dead, or of hurting yourself in some way Not at all Not at all Not at all  PHQ-9 Total Score 1 0 3          07/05/2022    8:38 AM 06/16/2020    9:31 AM 06/22/2016   10:11 AM 06/22/2013    8:47 AM  Fall Risk   Falls in the past year? 0 0 No No  Number falls in past yr: 0     Injury with Fall? 0          07/05/2022    8:38 AM 06/22/2021    9:29 AM 06/16/2020    9:31 AM  PHQ9 SCORE ONLY  PHQ-9 Total Score 1 0 3    There are no preventive care reminders to display for this patient.  Health Maintenance Due  Topic Date Due   Zoster Vaccines- Shingrix (1 of 2) Never done   Pneumonia Vaccine 40+ Years old (2 of 2 - PCV) 06/09/2006   COVID-19 Vaccine (3 - Moderna risk series) 06/21/2019   OPHTHALMOLOGY EXAM  02/17/2020   Colonoscopy  06/13/2020   FOOT EXAM  07/22/2020   MAMMOGRAM  09/30/2021   DEXA SCAN  Never done   Diabetic kidney evaluation - Urine ACR  06/23/2022      History/P.E. limitations: none  There are no preventive care reminders to display for this patient.  Diabetes Health Maintenance Due  Topic Date Due   OPHTHALMOLOGY EXAM  02/17/2020    FOOT EXAM  07/22/2020   HEMOGLOBIN A1C  01/04/2023    Health Maintenance Due  Topic Date Due   Zoster Vaccines- Shingrix (1 of 2) Never done   Pneumonia Vaccine 48+ Years old (2 of 2 - PCV) 06/09/2006   COVID-19 Vaccine (3 - Moderna risk series) 06/21/2019   OPHTHALMOLOGY EXAM  02/17/2020   Colonoscopy  06/13/2020   FOOT EXAM  07/22/2020   MAMMOGRAM  09/30/2021   DEXA SCAN  Never done   Diabetic kidney evaluation - Urine ACR  06/23/2022     Chief Complaint  Patient presents with   T2DM f/u     --------------------------------------------------------------------------------------------------------------------------------------------- Visit Problem List with A/P  No problem-specific Assessment & Plan notes found for this encounter.

## 2022-07-08 ENCOUNTER — Other Ambulatory Visit: Payer: Self-pay | Admitting: Family Medicine

## 2022-07-08 DIAGNOSIS — E1165 Type 2 diabetes mellitus with hyperglycemia: Secondary | ICD-10-CM

## 2022-07-08 DIAGNOSIS — I10 Essential (primary) hypertension: Secondary | ICD-10-CM

## 2022-07-09 ENCOUNTER — Other Ambulatory Visit (HOSPITAL_COMMUNITY): Payer: Self-pay

## 2022-07-10 ENCOUNTER — Telehealth: Payer: Self-pay

## 2022-07-10 DIAGNOSIS — E1149 Type 2 diabetes mellitus with other diabetic neurological complication: Secondary | ICD-10-CM

## 2022-07-10 DIAGNOSIS — E113293 Type 2 diabetes mellitus with mild nonproliferative diabetic retinopathy without macular edema, bilateral: Secondary | ICD-10-CM

## 2022-07-10 NOTE — Telephone Encounter (Signed)
Mailed re-enrollment application for Januvia ([employer]) and Jardiance ([employer]) to patients home.

## 2022-07-11 NOTE — Telephone Encounter (Signed)
Was able to submit re-enrollment via online portal for Januvia (MERCK PAP).   Submitted application for JANUVIA to [facility] for patient assistance.  Will disregard returned application from patient.   Phone: [phone]

## 2022-07-17 NOTE — Telephone Encounter (Signed)
Received notification from [facility] regarding approval for JANUVIA. Patient assistance approved from 07/16/22 to 07/16/23.  Please escribe a new 90 day supply with refills to [facility]. Medication will to ship to patients home.  [employer] phone: [phone]

## 2022-07-18 MED ORDER — JANUVIA 100 MG PO TABS
100.0000 mg | ORAL_TABLET | Freq: Every day | ORAL | 3 refills | Status: DC
Start: 2022-07-18 — End: 2023-05-07

## 2022-07-18 NOTE — Addendum Note (Signed)
Addended by: Acquanetta Belling D on: 07/18/2022 08:24 AM   Modules accepted: Orders

## 2022-07-18 NOTE — Telephone Encounter (Signed)
PRESCRIPTION Januvia sent [facility]

## 2022-07-27 NOTE — Progress Notes (Signed)
.  [email or web address] 

## 2022-08-01 NOTE — Telephone Encounter (Signed)
Rec'd pt pages back.   Pcp pages placed in providers box for signature.

## 2022-08-01 NOTE — Progress Notes (Signed)
Reviewed and agree with Dr Koval's plan.   

## 2022-08-07 NOTE — Telephone Encounter (Signed)
Submitted re-enrollment application for JARDIANCE to [employer]) for patient assistance.   Phone: [phone]

## 2022-08-14 NOTE — Telephone Encounter (Signed)
Received notification from [employer]) regarding approval for [employer]. Patient assistance approved from 08/14/22 to 01/08/23.  Medication will ship to patients home. Pt will need to call company for refills.  Phone: [phone]

## 2022-10-02 ENCOUNTER — Ambulatory Visit (INDEPENDENT_AMBULATORY_CARE_PROVIDER_SITE_OTHER): Payer: Self-pay | Admitting: Pharmacist

## 2022-10-02 ENCOUNTER — Encounter: Payer: Self-pay | Admitting: Pharmacist

## 2022-10-02 VITALS — BP 128/56 | HR 77 | Wt 99.6 lb

## 2022-10-02 DIAGNOSIS — Z7984 Long term (current) use of oral hypoglycemic drugs: Secondary | ICD-10-CM

## 2022-10-02 DIAGNOSIS — E1159 Type 2 diabetes mellitus with other circulatory complications: Secondary | ICD-10-CM

## 2022-10-02 DIAGNOSIS — E1169 Type 2 diabetes mellitus with other specified complication: Secondary | ICD-10-CM

## 2022-10-02 DIAGNOSIS — E782 Mixed hyperlipidemia: Secondary | ICD-10-CM

## 2022-10-02 DIAGNOSIS — I152 Hypertension secondary to endocrine disorders: Secondary | ICD-10-CM

## 2022-10-02 DIAGNOSIS — E1149 Type 2 diabetes mellitus with other diabetic neurological complication: Secondary | ICD-10-CM

## 2022-10-02 LAB — POCT GLYCOSYLATED HEMOGLOBIN (HGB A1C): HbA1c, POC (controlled diabetic range): 7.2 % — AB (ref 0.0–7.0)

## 2022-10-02 MED ORDER — FREESTYLE LIBRE 3 SENSOR MISC
1.0000 | 11 refills | Status: DC
Start: 2022-10-02 — End: 2023-10-07

## 2022-10-02 NOTE — Patient Instructions (Signed)
It was nice to see you today!  Your goal blood sugar is 80-130 before eating and less than 180 after eating.  Medication Changes:  We have sent a script for the [facility] 3 sensors to your pharmacy.  Continue all other medication the same.   Keep up the good work with diet and exercise. Aim for a diet full of vegetables, fruit and lean meats (chicken, [place], fish). Try to limit salt intake by eating fresh or frozen vegetables (instead of canned), rinse canned vegetables prior to cooking and do not add any additional salt to meals.

## 2022-10-02 NOTE — Assessment & Plan Note (Signed)
LIBERATE Study:  Patient is interested in continuing use of Libre 3 sensors. - Sent a script to patient's pharmacy for sensors.  Diabetes longstanding currently controlled with latest GMI of 6.9% and A1c of 7.2%. Patient is able to verbalize appropriate hypoglycemia management plan. Medication adherence appears good.  - Continued Jardiance (empagliflozin) 25 mg once daily. - Continued metformin 1000 mg 1.5 tablets in the morning and 1 tablet in the evening. - Continued Januvia (sitagliptin) 100 mg once daily. - Patient educated on purpose, proper use, and potential adverse effects.  - Extensively discussed pathophysiology of diabetes, recommended lifestyle interventions, dietary effects on blood sugar control.  - Counseled on s/sx of and management of hypoglycemia.

## 2022-10-02 NOTE — Assessment & Plan Note (Signed)
ASCVD risk - primary prevention in patient with diabetes. Last LDL (06/2022) is 68 at goal of <70 mg/dL.  - Continued atorvastatin 40 mg once daily.

## 2022-10-02 NOTE — Progress Notes (Signed)
S:     Chief Complaint  Patient presents with   Medication Management    Liberate 6 month visit   67 y.o. female who presents for diabetes evaluation, education, and management in the context of the LIBERATE Study.  PMH is significant for HTN, HLD, and diabetes.  Patient was referred and last seen by Primary Care Provider, Dr. McDiarmid, on 07/05/22.  At last visit, continued all medications as before.   Today, patient arrives in good spirits and presents without any assistance.   Patient reports Diabetes was diagnosed in 2011.   Family/Social History: father and brother have diabetes  Current diabetes medications include: Jardiance (empagliflozin) 25 mg once daily, metformin 1000 mg 1.5 tablets in the morning and 1 tablet in the evening, Januvia (sitagliptin) 100 mg once daily Current hypertension medications include: Zestoretic (lisinopril-hydrochlorothiazide) 10-12.5 mg once daily Current hyperlipidemia medications include: atorvastatin 40 mg once daily  Patient reports adherence to taking all medications as prescribed.   Do you feel that your medications are working for you? yes Have you been experiencing any side effects to the medications prescribed? no  Insurance coverage: Generic Commercial  Patient denies hypoglycemic events.  Patient reports nocturia (nighttime urination) ~2 times but improved from before. Patient denies neuropathy (nerve pain). Patient denies visual changes. Patient reports self foot exams.   O:   Review of Systems  All other systems reviewed and are negative.   Physical Exam Constitutional:      Appearance: Normal appearance.  Pulmonary:     Effort: Pulmonary effort is normal.  Neurological:     Mental Status: She is alert.  Psychiatric:        Mood and Affect: Mood normal.        Behavior: Behavior normal.        Thought Content: Thought content normal.        Judgment: Judgment normal.      Lab Results  Component Value  Date   HGBA1C 7.2 (A) 10/02/2022   Freestyle Libre 3 CGM Download:  % Time CGM is active: 96% Average Glucose: 148 mg/dL Glucose Management Indicator: 6.9%  Glucose Variability: 33.1% (goal <36%) Time in Goal:  - Time in range 70-180: 77% - Time above range: 23% (19% High 181-250 mg/dL, 4% Very high 409 mg/dL)  POC W1X Today: 9.1%  Vitals:   10/02/22 1107  BP: (!) 128/56  Pulse: 77  SpO2: 100%     Lipid Panel     Component Value Date/Time   CHOL 131 07/05/2022 1155   TRIG 146 07/05/2022 1155   HDL 38 (L) 07/05/2022 1155   CHOLHDL 3.4 07/05/2022 1155   CHOLHDL 3.9 02/24/2015 1006   VLDL 27 02/24/2015 1006   LDLCALC 68 07/05/2022 1155   LDLDIRECT 97 06/22/2021 1008   LDLDIRECT 142 (H) 04/28/2012 1526    Clinical Atherosclerotic Cardiovascular Disease (ASCVD): No  The 10-year ASCVD risk score (Arnett DK, et al., 2019) is: 13.1%   Values used to calculate the score:     Age: 71 years     Sex: Female     Is Non-Hispanic African American: No     Diabetic: Yes     Tobacco smoker: No     Systolic Blood Pressure: 128 mmHg     Is BP treated: Yes     HDL Cholesterol: 38 mg/dL     Total Cholesterol: 131 mg/dL   A/P:  LIBERATE Study:  Patient is interested in continuing use of Libre 3  sensors. - Sent a script to patient's pharmacy for sensors.  Diabetes longstanding currently controlled with latest GMI of 6.9% and A1c of 7.2%. Patient is able to verbalize appropriate hypoglycemia management plan. Medication adherence appears good.  - Continued Jardiance (empagliflozin) 25 mg once daily. - Continued metformin 1000 mg 1.5 tablets in the morning and 1 tablet in the evening. - Continued Januvia (sitagliptin) 100 mg once daily. - Patient educated on purpose, proper use, and potential adverse effects.  - Extensively discussed pathophysiology of diabetes, recommended lifestyle interventions, dietary effects on blood sugar control.  - Counseled on s/sx of and management of  hypoglycemia.   ASCVD risk - primary prevention in patient with diabetes. Last LDL (06/2022) is 68 at goal of <70 mg/dL.  - Continued atorvastatin 40 mg once daily.  Hypertension longstanding currently controlled with in-office reading of 128/56 mm Hg. Blood pressure goal of <130/80 mmHg. Medication adherence appears good.  - Continued Zestoretic (lisinopril-hydrochlorothiazide) 10-12.5 mg once daily.  Written patient instructions provided. Patient verbalized understanding of treatment plan.  Total time in face to face counseling 29 minutes.    Follow-up:  Pharmacist PRN. PCP clinic visit in 12/13/22.  Patient seen with Andee Poles, PharmD Candidate. Marland Kitchen

## 2022-10-02 NOTE — Assessment & Plan Note (Signed)
Hypertension longstanding currently controlled with in-office reading of 128/56 mm Hg. Blood pressure goal of <130/80 mmHg. Medication adherence appears good.

## 2022-10-03 NOTE — Progress Notes (Signed)
Reviewed and agree with Dr Koval's plan.   

## 2022-11-12 ENCOUNTER — Other Ambulatory Visit: Payer: Self-pay | Admitting: Family Medicine

## 2022-11-12 DIAGNOSIS — E781 Pure hyperglyceridemia: Secondary | ICD-10-CM

## 2022-12-13 ENCOUNTER — Encounter: Payer: Self-pay | Admitting: Family Medicine

## 2022-12-13 ENCOUNTER — Ambulatory Visit (INDEPENDENT_AMBULATORY_CARE_PROVIDER_SITE_OTHER): Payer: Self-pay | Admitting: Family Medicine

## 2022-12-13 VITALS — BP 125/63 | HR 82 | Ht <= 58 in | Wt 101.0 lb

## 2022-12-13 DIAGNOSIS — R809 Proteinuria, unspecified: Secondary | ICD-10-CM

## 2022-12-13 DIAGNOSIS — E1149 Type 2 diabetes mellitus with other diabetic neurological complication: Secondary | ICD-10-CM

## 2022-12-13 DIAGNOSIS — E113293 Type 2 diabetes mellitus with mild nonproliferative diabetic retinopathy without macular edema, bilateral: Secondary | ICD-10-CM

## 2022-12-13 LAB — POCT GLYCOSYLATED HEMOGLOBIN (HGB A1C): HbA1c, POC (controlled diabetic range): 7 % (ref 0.0–7.0)

## 2022-12-13 NOTE — Progress Notes (Signed)
Erin Small is alone Sources of clinical information for visit is/are patient. Nursing assessment for this office visit was reviewed with the patient for accuracy and revision.     Previous Report(s) Reviewed: none     07/05/2022    8:38 AM  Depression screen PHQ 2/9  Decreased Interest 0  Down, Depressed, Hopeless 0  PHQ - 2 Score 0  Altered sleeping 1  Tired, decreased energy 0  Change in appetite 0  Feeling bad or failure about yourself  0  Trouble concentrating 0  Moving slowly or fidgety/restless 0  Suicidal thoughts 0  PHQ-9 Score 1   Flowsheet Row Office Visit from 07/05/2022 in [facility] [place]. [facility] Office Visit from 06/22/2021 in [facility] [place]. [facility] Office Visit from 06/16/2020 in [facility] [place]. [facility]  Thoughts that you would be better off dead, or of hurting yourself in some way Not at all Not at all Not at all  PHQ-9 Total Score 1 0 3          07/05/2022    8:38 AM 06/16/2020    9:31 AM 06/22/2016   10:11 AM 06/22/2013    8:47 AM  Fall Risk   Falls in the past year? 0 0 No No  Number falls in past yr: 0     Injury with Fall? 0          07/05/2022    8:38 AM 06/22/2021    9:29 AM 06/16/2020    9:31 AM  PHQ9 SCORE ONLY  PHQ-9 Total Score 1 0 3    There are no preventive care reminders to display for this patient.  Health Maintenance Due  Topic Date Due   Zoster Vaccines- Shingrix (1 of 2) Never done   Pneumonia Vaccine 76+ Years old (2 of 2 - PCV) 06/09/2006   COVID-19 Vaccine (3 - Moderna risk series) 06/21/2019   OPHTHALMOLOGY EXAM  02/17/2020   Colonoscopy  06/13/2020   FOOT EXAM  07/22/2020   MAMMOGRAM  09/30/2021   DEXA SCAN  Never done   INFLUENZA VACCINE  08/09/2022      History/P.E. limitations: none  There are no preventive care reminders to display for this patient.  Diabetes Health Maintenance  Due  Topic Date Due   OPHTHALMOLOGY EXAM  02/17/2020   FOOT EXAM  07/22/2020   HEMOGLOBIN A1C  04/01/2023    Health Maintenance Due  Topic Date Due   Zoster Vaccines- Shingrix (1 of 2) Never done   Pneumonia Vaccine 58+ Years old (2 of 2 - PCV) 06/09/2006   COVID-19 Vaccine (3 - Moderna risk series) 06/21/2019   OPHTHALMOLOGY EXAM  02/17/2020   Colonoscopy  06/13/2020   FOOT EXAM  07/22/2020   MAMMOGRAM  09/30/2021   DEXA SCAN  Never done   INFLUENZA VACCINE  08/09/2022     No chief complaint on file.    --------------------------------------------------------------------------------------------------------------------------------------------- Visit Problem List with A/P  No problem-specific Assessment & Plan notes found for this encounter.

## 2022-12-13 NOTE — Assessment & Plan Note (Addendum)
Established problem Well Controlled. Patient is at goal of A1c < 7.5%. No signs of complications, medication side effects, or red flags. Continue current medications and other regiments. Applications to manufacturer for [employer]. Continue metformnin, Januvia and Jardiance.

## 2022-12-13 NOTE — Patient Instructions (Signed)
Your A1c is 7.0%.  Excellent blood control. Keep taking your Jardiance, Januvia and metformin.   Dr Mardell Cragg will send a note to our pharmacy team to look into the renewal of your Januvia and Jardiance.  Your blood pressure is under good control.  Keep taking the Lisinopril-hydrochlorothiazide blood pressure medication.   Dr Daune Divirgilio is glad you are going for your eye exam and your vaccinations.

## 2022-12-19 ENCOUNTER — Telehealth: Payer: Self-pay

## 2022-12-19 NOTE — Progress Notes (Unsigned)
Pharmacy Medication Assistance Program Note    12/19/2022  Patient ID: Erin Small, female   DOB: Mar 17, 1955, 67 y.o.   MRN: [id number]     12/19/2022  Outreach Medication One  Manufacturer Medication One [employer]  [employer] Drugs Jardiance  Type of Radiographer, therapeutic Assistance  Date Application Submitted to Manufacturer 12/19/2022  Method Application Sent to [employer]

## 2023-03-18 ENCOUNTER — Other Ambulatory Visit: Payer: No Typology Code available for payment source

## 2023-05-06 ENCOUNTER — Other Ambulatory Visit: Payer: Self-pay | Admitting: Family Medicine

## 2023-05-06 DIAGNOSIS — E113293 Type 2 diabetes mellitus with mild nonproliferative diabetic retinopathy without macular edema, bilateral: Secondary | ICD-10-CM

## 2023-05-06 DIAGNOSIS — E1149 Type 2 diabetes mellitus with other diabetic neurological complication: Secondary | ICD-10-CM

## 2023-06-24 ENCOUNTER — Telehealth: Payer: Self-pay

## 2023-06-24 NOTE — Progress Notes (Signed)
 Pharmacy Medication Assistance Program Note    08/09/2023  Patient ID: Erin Small, female  DOB: Apr 17, 1955, 68 y.o.  MRN:  [id number]     06/24/2023  Outreach Medication Two  Manufacturer Medication Two [employer]  [employer] Drugs Januvia   Type of Radiographer, therapeutic Assistance  Date Application Sent to Patient 06/25/2023  Date Application Sent to Prescriber 07/23/2023  Name of Prescriber Krystal McDiarmid  Date Application Received From Patient 07/23/2023  Application Items Received From Patient Application;Proof of Income  Method Application Sent to Manufacturer Fax  Date Application Submitted to Manufacturer 07/31/2023  Patient Assistance Determination Approved  Approval Start Date 08/05/2023     RENEWAL APPROVED  Pharmacy delivered medication 08/05/23 and delivered 08/08/23

## 2023-06-26 ENCOUNTER — Other Ambulatory Visit: Payer: Self-pay | Admitting: Family Medicine

## 2023-06-26 DIAGNOSIS — I10 Essential (primary) hypertension: Secondary | ICD-10-CM

## 2023-08-15 ENCOUNTER — Encounter: Payer: Self-pay | Admitting: Family Medicine

## 2023-08-15 ENCOUNTER — Ambulatory Visit (INDEPENDENT_AMBULATORY_CARE_PROVIDER_SITE_OTHER): Payer: Self-pay | Admitting: Family Medicine

## 2023-08-15 VITALS — BP 123/72 | HR 67 | Ht <= 58 in | Wt 105.4 lb

## 2023-08-15 DIAGNOSIS — Z13820 Encounter for screening for osteoporosis: Secondary | ICD-10-CM

## 2023-08-15 DIAGNOSIS — E782 Mixed hyperlipidemia: Secondary | ICD-10-CM

## 2023-08-15 DIAGNOSIS — E1149 Type 2 diabetes mellitus with other diabetic neurological complication: Secondary | ICD-10-CM

## 2023-08-15 DIAGNOSIS — I152 Hypertension secondary to endocrine disorders: Secondary | ICD-10-CM

## 2023-08-15 DIAGNOSIS — E113293 Type 2 diabetes mellitus with mild nonproliferative diabetic retinopathy without macular edema, bilateral: Secondary | ICD-10-CM

## 2023-08-15 DIAGNOSIS — E1169 Type 2 diabetes mellitus with other specified complication: Secondary | ICD-10-CM

## 2023-08-15 DIAGNOSIS — E1159 Type 2 diabetes mellitus with other circulatory complications: Secondary | ICD-10-CM

## 2023-08-15 LAB — POCT GLYCOSYLATED HEMOGLOBIN (HGB A1C): HbA1c, POC (controlled diabetic range): 8.7 % — AB (ref 0.0–7.0)

## 2023-08-15 NOTE — Assessment & Plan Note (Signed)
-   Patient following up at [facility] next month

## 2023-08-15 NOTE — Assessment & Plan Note (Signed)
-   A1c elevated to 8.7 today from 7.0 prior.  - Barriers to glucose monitoring due to affordability - Recommended trying pharmacy coupon to obtain FreeStyle Libre - Checked microalbumin/creatinine ratio - Encouraged dietary awareness and blood sugar monitoring when able - Reinforced importance of physical activity and stretching after work

## 2023-08-15 NOTE — Progress Notes (Signed)
    SUBJECTIVE:   CHIEF COMPLAINT / HPI:   Erin Small presents for follow-up of type 2 diabetes and related labs. She has not been able to check her blood sugars at home due to cost barriers since she is uninsured. She is unable to afford the [facility] or test strips at this time. She was previously enrolled in the LIBERATE study but is no longer covered.  She was seen at [facility] a few years ago and was diagnosed with diabetic retinopathy in both eyes; she has a follow-up eye appointment scheduled next month. She is also due for a DEXA scan but expresses concern about the cost so she will schedule it when she is able to afford it.   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  - Barriers to glucose monitoring due to affordability - Recommended trying pharmacy coupon to obtain FreeStyle Libre - Checked microalbumin/creatinine ratio - Encouraged dietary awareness and blood sugar monitoring when able - Reinforced importance of physical activity and stretching after work Mixed hyperlipidemia due to type 2 diabetes mellitus (HCC) - Lipids checked today; will review results when available Screening for osteoporosis - Discussed importance of DEXA scan, will schedule it when she is able to afford it.     Nonda Carrie, Medical Student [facility]

## 2023-08-15 NOTE — Patient Instructions (Addendum)
 Hi Erin Small,  It was good to see you today!  We checked your A1c, lipids, kidney function, and electrolytes.   We chatted about a DEXA (bone mineral density) scan today but feel free to schedule it at the breast center whenever you are able to afford it. Please also keep an eye out for the phone call regarding scheduling a colonoscopy.   Please try to use the coupon at the pharmacy to pick up your FreeStyle [place]. Try to monitor your diet and blood sugars at home to manage your diabetes. 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  We will see you in 6 weeks!

## 2023-08-15 NOTE — Assessment & Plan Note (Signed)
-   Lipids checked today; will review results when available

## 2023-08-16 ENCOUNTER — Ambulatory Visit: Payer: Self-pay | Admitting: Family Medicine

## 2023-08-16 ENCOUNTER — Encounter: Payer: Self-pay | Admitting: Family Medicine

## 2023-08-16 LAB — MICROALBUMIN, URINE: Microalbumin, Urine: 6.8 ug/mL

## 2023-08-16 LAB — LIPID PANEL
Chol/HDL Ratio: 4.3 ratio (ref 0.0–4.4)
Cholesterol, Total: 197 mg/dL (ref 100–199)
HDL: 46 mg/dL (ref >39)
LDL Chol Calc (NIH): 129 mg/dL — ABNORMAL HIGH (ref 0–99)
Triglycerides: 121 mg/dL (ref 0–149)
VLDL Cholesterol Cal: 22 mg/dL (ref 5–40)

## 2023-08-16 LAB — BASIC METABOLIC PANEL WITH GFR
BUN/Creatinine Ratio: 33 — ABNORMAL HIGH (ref 12–28)
BUN: 34 mg/dL — ABNORMAL HIGH (ref 8–27)
CO2: 21 mmol/L (ref 20–29)
Calcium: 9.7 mg/dL (ref 8.7–10.3)
Chloride: 103 mmol/L (ref 96–106)
Creatinine, Ser: 1.02 mg/dL — ABNORMAL HIGH (ref 0.57–1.00)
Glucose: 129 mg/dL — ABNORMAL HIGH (ref 70–99)
Potassium: 4.9 mmol/L (ref 3.5–5.2)
Sodium: 139 mmol/L (ref 134–144)
eGFR: 61 mL/min/1.73 (ref >59)

## 2023-08-16 NOTE — Assessment & Plan Note (Signed)
 Established problem. Adequate blood pressure control.  No evidence of new end organ damage.  Tolerating medication without significant adverse effects.  Plan to continue current blood pressure medication regiment.

## 2023-08-16 NOTE — Progress Notes (Signed)
 I have seen and examined this patient with MS Khawaja, and reviewed their chart. I have discussed this patient with the MS Kwawaja. I agree with the resident's findings, assessment and care plan.  Ms Vandeven wants to try monitoring her CBGs wither with glucometer or CGM.  This has helped her manage her glycemic control in past.  Non-injection additional therapy would include glipizide  which Ms Mealing has taken before with benefit and no intolerance.   HYPERLIPIDEMIA  Disease Monitoring: See symptoms for Hypertension  Medications: Compliance- taking atorvastatin  40 mg daily without intolerance.

## 2023-09-06 ENCOUNTER — Other Ambulatory Visit: Payer: Self-pay | Admitting: Family Medicine

## 2023-09-06 DIAGNOSIS — E1149 Type 2 diabetes mellitus with other diabetic neurological complication: Secondary | ICD-10-CM

## 2023-10-07 ENCOUNTER — Other Ambulatory Visit: Payer: Self-pay | Admitting: Family Medicine

## 2023-10-07 DIAGNOSIS — E1149 Type 2 diabetes mellitus with other diabetic neurological complication: Secondary | ICD-10-CM

## 2023-10-15 LAB — OPHTHALMOLOGY REPORT-SCANNED

## 2023-11-05 ENCOUNTER — Other Ambulatory Visit: Payer: Self-pay | Admitting: Family Medicine

## 2023-11-05 DIAGNOSIS — E1165 Type 2 diabetes mellitus with hyperglycemia: Secondary | ICD-10-CM

## 2024-01-13 ENCOUNTER — Other Ambulatory Visit (HOSPITAL_COMMUNITY): Payer: Self-pay

## 2024-01-13 ENCOUNTER — Telehealth: Payer: Self-pay

## 2024-01-13 NOTE — Telephone Encounter (Signed)
 In process of mailing [employer] re-enrollment application for Jardiance  assistance.

## 2024-01-14 NOTE — Telephone Encounter (Signed)
 PAP: Patient assistance application for Jardiance  through [employer]) has been mailed to pt's home address on file.

## 2024-01-29 NOTE — Telephone Encounter (Signed)
 Rec'd completed patient pages.   In process of completing provider pages.

## 2024-02-11 NOTE — Telephone Encounter (Signed)
 In PCPs box awaiting signature.

## 2024-02-11 NOTE — Telephone Encounter (Signed)
 PAP: RE-ENROLLMLENT application for Jardiance  has been submitted to [employer]), via fax

## 2024-02-14 NOTE — Telephone Encounter (Signed)
 Received call from patient assistance. They report that application has incorrect date with provider's signatures. This is dated for April 2026.  They are faxing over a new copy for corrections.   Fyi to PCP.   Chiquita JAYSON English, RN

## 2024-02-14 NOTE — Telephone Encounter (Addendum)
" ° ° ° °  I have patients application.  Will get dates corrected and resigned when in office 02/18/24.  "
